# Patient Record
Sex: Male | Born: 1955 | Smoking: Current some day smoker
Health system: Southern US, Community
[De-identification: ages and names within clinical notes are randomized; demographics above are authoritative.]

## PROBLEM LIST (undated history)

## (undated) DIAGNOSIS — I639 Cerebral infarction, unspecified: Secondary | ICD-10-CM

## (undated) DIAGNOSIS — I2510 Atherosclerotic heart disease of native coronary artery without angina pectoris: Secondary | ICD-10-CM

## (undated) HISTORY — PX: HIP SURGERY: SHX245

---

## 2017-06-11 DIAGNOSIS — R05 Cough: Secondary | ICD-10-CM | POA: Diagnosis not present

## 2017-06-11 DIAGNOSIS — I10 Essential (primary) hypertension: Secondary | ICD-10-CM | POA: Diagnosis not present

## 2017-06-11 DIAGNOSIS — I2510 Atherosclerotic heart disease of native coronary artery without angina pectoris: Secondary | ICD-10-CM | POA: Diagnosis not present

## 2017-06-11 DIAGNOSIS — F411 Generalized anxiety disorder: Secondary | ICD-10-CM | POA: Diagnosis not present

## 2017-07-04 DIAGNOSIS — M5412 Radiculopathy, cervical region: Secondary | ICD-10-CM | POA: Diagnosis not present

## 2017-07-04 DIAGNOSIS — G894 Chronic pain syndrome: Secondary | ICD-10-CM | POA: Diagnosis not present

## 2017-07-04 DIAGNOSIS — M62838 Other muscle spasm: Secondary | ICD-10-CM | POA: Diagnosis not present

## 2017-07-04 DIAGNOSIS — M4722 Other spondylosis with radiculopathy, cervical region: Secondary | ICD-10-CM | POA: Diagnosis not present

## 2017-08-21 DIAGNOSIS — R29898 Other symptoms and signs involving the musculoskeletal system: Secondary | ICD-10-CM | POA: Diagnosis not present

## 2017-08-21 DIAGNOSIS — M6281 Muscle weakness (generalized): Secondary | ICD-10-CM | POA: Diagnosis not present

## 2017-08-21 DIAGNOSIS — G9589 Other specified diseases of spinal cord: Secondary | ICD-10-CM | POA: Diagnosis not present

## 2017-08-21 DIAGNOSIS — M542 Cervicalgia: Secondary | ICD-10-CM | POA: Diagnosis not present

## 2017-08-21 DIAGNOSIS — M6258 Muscle wasting and atrophy, not elsewhere classified, other site: Secondary | ICD-10-CM | POA: Diagnosis not present

## 2017-09-11 DIAGNOSIS — M542 Cervicalgia: Secondary | ICD-10-CM | POA: Diagnosis not present

## 2017-09-11 DIAGNOSIS — R29898 Other symptoms and signs involving the musculoskeletal system: Secondary | ICD-10-CM | POA: Diagnosis not present

## 2017-09-11 DIAGNOSIS — M6258 Muscle wasting and atrophy, not elsewhere classified, other site: Secondary | ICD-10-CM | POA: Diagnosis not present

## 2017-09-11 DIAGNOSIS — M6281 Muscle weakness (generalized): Secondary | ICD-10-CM | POA: Diagnosis not present

## 2017-09-11 DIAGNOSIS — G9589 Other specified diseases of spinal cord: Secondary | ICD-10-CM | POA: Diagnosis not present

## 2017-09-17 DIAGNOSIS — I10 Essential (primary) hypertension: Secondary | ICD-10-CM | POA: Diagnosis not present

## 2017-09-17 DIAGNOSIS — E7800 Pure hypercholesterolemia, unspecified: Secondary | ICD-10-CM | POA: Diagnosis not present

## 2017-09-17 DIAGNOSIS — Z125 Encounter for screening for malignant neoplasm of prostate: Secondary | ICD-10-CM | POA: Diagnosis not present

## 2017-09-17 DIAGNOSIS — T148XXA Other injury of unspecified body region, initial encounter: Secondary | ICD-10-CM | POA: Diagnosis not present

## 2017-10-01 DIAGNOSIS — G894 Chronic pain syndrome: Secondary | ICD-10-CM | POA: Diagnosis not present

## 2017-10-01 DIAGNOSIS — M6258 Muscle wasting and atrophy, not elsewhere classified, other site: Secondary | ICD-10-CM | POA: Diagnosis not present

## 2017-10-01 DIAGNOSIS — M21371 Foot drop, right foot: Secondary | ICD-10-CM | POA: Diagnosis not present

## 2017-10-01 DIAGNOSIS — M5412 Radiculopathy, cervical region: Secondary | ICD-10-CM | POA: Diagnosis not present

## 2017-10-04 DIAGNOSIS — M6258 Muscle wasting and atrophy, not elsewhere classified, other site: Secondary | ICD-10-CM | POA: Diagnosis not present

## 2017-10-04 DIAGNOSIS — G9589 Other specified diseases of spinal cord: Secondary | ICD-10-CM | POA: Diagnosis not present

## 2017-10-04 DIAGNOSIS — R29898 Other symptoms and signs involving the musculoskeletal system: Secondary | ICD-10-CM | POA: Diagnosis not present

## 2017-10-04 DIAGNOSIS — M542 Cervicalgia: Secondary | ICD-10-CM | POA: Diagnosis not present

## 2017-10-04 DIAGNOSIS — M6281 Muscle weakness (generalized): Secondary | ICD-10-CM | POA: Diagnosis not present

## 2017-10-18 DIAGNOSIS — M25471 Effusion, right ankle: Secondary | ICD-10-CM | POA: Diagnosis not present

## 2017-10-29 DIAGNOSIS — R079 Chest pain, unspecified: Secondary | ICD-10-CM | POA: Diagnosis not present

## 2017-10-29 DIAGNOSIS — K760 Fatty (change of) liver, not elsewhere classified: Secondary | ICD-10-CM | POA: Diagnosis not present

## 2017-10-29 DIAGNOSIS — R0602 Shortness of breath: Secondary | ICD-10-CM | POA: Diagnosis not present

## 2017-10-29 DIAGNOSIS — G894 Chronic pain syndrome: Secondary | ICD-10-CM | POA: Diagnosis not present

## 2017-10-29 DIAGNOSIS — R55 Syncope and collapse: Secondary | ICD-10-CM | POA: Diagnosis not present

## 2017-10-29 DIAGNOSIS — M21371 Foot drop, right foot: Secondary | ICD-10-CM | POA: Diagnosis not present

## 2017-10-29 DIAGNOSIS — G9589 Other specified diseases of spinal cord: Secondary | ICD-10-CM | POA: Diagnosis not present

## 2017-10-29 DIAGNOSIS — F10129 Alcohol abuse with intoxication, unspecified: Secondary | ICD-10-CM | POA: Diagnosis not present

## 2017-10-29 DIAGNOSIS — Z9889 Other specified postprocedural states: Secondary | ICD-10-CM | POA: Diagnosis not present

## 2017-10-29 DIAGNOSIS — R072 Precordial pain: Secondary | ICD-10-CM | POA: Diagnosis not present

## 2017-10-29 DIAGNOSIS — R2689 Other abnormalities of gait and mobility: Secondary | ICD-10-CM | POA: Diagnosis not present

## 2017-10-29 DIAGNOSIS — R296 Repeated falls: Secondary | ICD-10-CM | POA: Diagnosis not present

## 2017-10-29 DIAGNOSIS — I679 Cerebrovascular disease, unspecified: Secondary | ICD-10-CM | POA: Diagnosis not present

## 2017-10-29 DIAGNOSIS — R0789 Other chest pain: Secondary | ICD-10-CM | POA: Diagnosis not present

## 2017-10-29 DIAGNOSIS — I6782 Cerebral ischemia: Secondary | ICD-10-CM | POA: Diagnosis not present

## 2017-10-29 DIAGNOSIS — T148XXA Other injury of unspecified body region, initial encounter: Secondary | ICD-10-CM | POA: Diagnosis not present

## 2017-10-29 DIAGNOSIS — R531 Weakness: Secondary | ICD-10-CM | POA: Diagnosis not present

## 2017-10-29 DIAGNOSIS — R404 Transient alteration of awareness: Secondary | ICD-10-CM | POA: Diagnosis not present

## 2017-10-30 DIAGNOSIS — I2510 Atherosclerotic heart disease of native coronary artery without angina pectoris: Secondary | ICD-10-CM | POA: Diagnosis not present

## 2017-10-30 DIAGNOSIS — R079 Chest pain, unspecified: Secondary | ICD-10-CM | POA: Diagnosis not present

## 2017-10-30 DIAGNOSIS — R072 Precordial pain: Secondary | ICD-10-CM | POA: Diagnosis not present

## 2017-10-30 DIAGNOSIS — Z7982 Long term (current) use of aspirin: Secondary | ICD-10-CM | POA: Diagnosis not present

## 2017-10-30 DIAGNOSIS — R0789 Other chest pain: Secondary | ICD-10-CM | POA: Diagnosis not present

## 2017-10-30 DIAGNOSIS — R0609 Other forms of dyspnea: Secondary | ICD-10-CM | POA: Diagnosis not present

## 2017-10-31 DIAGNOSIS — R296 Repeated falls: Secondary | ICD-10-CM | POA: Diagnosis not present

## 2017-10-31 DIAGNOSIS — G894 Chronic pain syndrome: Secondary | ICD-10-CM | POA: Diagnosis not present

## 2017-10-31 DIAGNOSIS — Z955 Presence of coronary angioplasty implant and graft: Secondary | ICD-10-CM | POA: Diagnosis not present

## 2017-10-31 DIAGNOSIS — G9589 Other specified diseases of spinal cord: Secondary | ICD-10-CM | POA: Diagnosis not present

## 2017-10-31 DIAGNOSIS — T148XXA Other injury of unspecified body region, initial encounter: Secondary | ICD-10-CM | POA: Diagnosis not present

## 2017-10-31 DIAGNOSIS — I2510 Atherosclerotic heart disease of native coronary artery without angina pectoris: Secondary | ICD-10-CM | POA: Diagnosis not present

## 2017-10-31 DIAGNOSIS — M6281 Muscle weakness (generalized): Secondary | ICD-10-CM | POA: Diagnosis not present

## 2017-11-02 DIAGNOSIS — I25118 Atherosclerotic heart disease of native coronary artery with other forms of angina pectoris: Secondary | ICD-10-CM | POA: Diagnosis not present

## 2017-11-02 DIAGNOSIS — R296 Repeated falls: Secondary | ICD-10-CM | POA: Diagnosis not present

## 2017-11-02 DIAGNOSIS — M4722 Other spondylosis with radiculopathy, cervical region: Secondary | ICD-10-CM | POA: Diagnosis not present

## 2017-11-02 DIAGNOSIS — M545 Low back pain: Secondary | ICD-10-CM | POA: Diagnosis not present

## 2017-11-06 ENCOUNTER — Encounter: Payer: Self-pay | Admitting: *Deleted

## 2017-11-06 ENCOUNTER — Other Ambulatory Visit: Payer: Self-pay | Admitting: *Deleted

## 2017-11-06 NOTE — Patient Outreach (Signed)
[facility]) Care Management  11/06/2017  LABAN OROURKE [birth year] [id number]  Referral via [facility]; member discharged from inpatient admission from [facility] 10/31/2017:  Telephone call to patient; recording states member not available; left HIPPA compliant voice mail.  Plan: Geophysicist/field seismologist. Follow up 2-4 business days.    Sherrin Daisy, RN BSN [place] Management Coordinator [facility] Care Management  [phone]

## 2017-11-08 ENCOUNTER — Other Ambulatory Visit: Payer: Self-pay | Admitting: *Deleted

## 2017-11-08 DIAGNOSIS — R748 Abnormal levels of other serum enzymes: Secondary | ICD-10-CM | POA: Diagnosis not present

## 2017-11-08 NOTE — Patient Outreach (Signed)
[facility]) Care Management  11/08/2017  Aaron Hicks December 09, 1955 [id number]  Referral via [place]; member discharged from inpatient admission from [place] 10/31/2017:  Telephone call attempt x 2; left HIPPA compliant voice mail requesting call back.  Plan: Follow up 2-4 business days. Outreach letter was sent 11/06/2017.   Sherrin Daisy, RN BSN [place] Management Coordinator [facility] Care Management  [phone]

## 2017-11-12 DIAGNOSIS — K219 Gastro-esophageal reflux disease without esophagitis: Secondary | ICD-10-CM | POA: Diagnosis not present

## 2017-11-12 DIAGNOSIS — G4700 Insomnia, unspecified: Secondary | ICD-10-CM | POA: Diagnosis not present

## 2017-11-12 DIAGNOSIS — G9589 Other specified diseases of spinal cord: Secondary | ICD-10-CM | POA: Diagnosis not present

## 2017-11-12 DIAGNOSIS — M4304 Spondylolysis, thoracic region: Secondary | ICD-10-CM | POA: Diagnosis not present

## 2017-11-12 DIAGNOSIS — G894 Chronic pain syndrome: Secondary | ICD-10-CM | POA: Diagnosis not present

## 2017-11-12 DIAGNOSIS — F329 Major depressive disorder, single episode, unspecified: Secondary | ICD-10-CM | POA: Diagnosis not present

## 2017-11-12 DIAGNOSIS — G8334 Monoplegia, unspecified affecting left nondominant side: Secondary | ICD-10-CM | POA: Diagnosis not present

## 2017-11-12 DIAGNOSIS — M4306 Spondylolysis, lumbar region: Secondary | ICD-10-CM | POA: Diagnosis not present

## 2017-11-12 DIAGNOSIS — M5412 Radiculopathy, cervical region: Secondary | ICD-10-CM | POA: Diagnosis not present

## 2017-11-12 DIAGNOSIS — Z9181 History of falling: Secondary | ICD-10-CM | POA: Diagnosis not present

## 2017-11-12 DIAGNOSIS — Z8673 Personal history of transient ischemic attack (TIA), and cerebral infarction without residual deficits: Secondary | ICD-10-CM | POA: Diagnosis not present

## 2017-11-12 DIAGNOSIS — Z7902 Long term (current) use of antithrombotics/antiplatelets: Secondary | ICD-10-CM | POA: Diagnosis not present

## 2017-11-12 DIAGNOSIS — E7800 Pure hypercholesterolemia, unspecified: Secondary | ICD-10-CM | POA: Diagnosis not present

## 2017-11-12 DIAGNOSIS — I10 Essential (primary) hypertension: Secondary | ICD-10-CM | POA: Diagnosis not present

## 2017-11-12 DIAGNOSIS — Z87891 Personal history of nicotine dependence: Secondary | ICD-10-CM | POA: Diagnosis not present

## 2017-11-12 DIAGNOSIS — M21371 Foot drop, right foot: Secondary | ICD-10-CM | POA: Diagnosis not present

## 2017-11-12 DIAGNOSIS — I2510 Atherosclerotic heart disease of native coronary artery without angina pectoris: Secondary | ICD-10-CM | POA: Diagnosis not present

## 2017-11-12 DIAGNOSIS — J309 Allergic rhinitis, unspecified: Secondary | ICD-10-CM | POA: Diagnosis not present

## 2017-11-20 ENCOUNTER — Other Ambulatory Visit: Payer: Self-pay | Admitting: *Deleted

## 2017-11-20 NOTE — Patient Outreach (Signed)
Aaron Hicks) Care Management  11/20/2017  Aaron Hicks January 02, 1956 [id number]   Referral via [place]; member discharged from inpatient admission from [place] 10/31/2017:  Telephone call #3 to patient; spoke with patient & advised of reason for call & of [facility] care management services.  HIPPA verification received.   Patient states  that he was in [facility] for 2 days under observation. States he had a fall prior to going to hospital but also had heart checked out to hx of chest discomfort for several days.   Voices that he is home now and has already see primary provider for follow up visit. States he has all of medications that he is suppose to have and is taking as prescribed by MD consistently. States he has no activity restrictions & is able to drive himself to MD appointments. Voices he sees other specialists as needed.  States he currently uses cane due to previous stroke .   States he knows when to call MD for abnormal symptoms & knows to call 911 for emergency medical problems.  States he has no health care concerns at this time & does not need [place] services. States he has contact information if needed.  Plan; Case closure.  Sherrin Daisy, RN BSN [place] Management Coordinator [facility] Care Management  [phone]

## 2017-12-11 DIAGNOSIS — N529 Male erectile dysfunction, unspecified: Secondary | ICD-10-CM | POA: Diagnosis not present

## 2017-12-11 DIAGNOSIS — R110 Nausea: Secondary | ICD-10-CM | POA: Diagnosis not present

## 2017-12-25 DIAGNOSIS — G894 Chronic pain syndrome: Secondary | ICD-10-CM | POA: Diagnosis not present

## 2018-02-27 DIAGNOSIS — E7800 Pure hypercholesterolemia, unspecified: Secondary | ICD-10-CM | POA: Diagnosis not present

## 2018-02-27 DIAGNOSIS — Z1211 Encounter for screening for malignant neoplasm of colon: Secondary | ICD-10-CM | POA: Diagnosis not present

## 2018-02-27 DIAGNOSIS — F411 Generalized anxiety disorder: Secondary | ICD-10-CM | POA: Diagnosis not present

## 2018-02-27 DIAGNOSIS — I10 Essential (primary) hypertension: Secondary | ICD-10-CM | POA: Diagnosis not present

## 2018-02-27 DIAGNOSIS — Z0001 Encounter for general adult medical examination with abnormal findings: Secondary | ICD-10-CM | POA: Diagnosis not present

## 2018-02-27 DIAGNOSIS — J302 Other seasonal allergic rhinitis: Secondary | ICD-10-CM | POA: Diagnosis not present

## 2018-02-27 DIAGNOSIS — Z23 Encounter for immunization: Secondary | ICD-10-CM | POA: Diagnosis not present

## 2018-03-20 ENCOUNTER — Inpatient Hospital Stay (HOSPITAL_COMMUNITY)
Admission: EM | Admit: 2018-03-20 | Discharge: 2018-03-21 | DRG: 092 | Disposition: A | Discharge status: Home / Self Care | Payer: PPO | Attending: Internal Medicine | Admitting: Internal Medicine

## 2018-03-20 ENCOUNTER — Encounter (HOSPITAL_COMMUNITY): Payer: Self-pay | Admitting: *Deleted

## 2018-03-20 ENCOUNTER — Inpatient Hospital Stay (HOSPITAL_COMMUNITY): Payer: PPO

## 2018-03-20 ENCOUNTER — Other Ambulatory Visit: Payer: Self-pay

## 2018-03-20 ENCOUNTER — Emergency Department (HOSPITAL_COMMUNITY): Payer: PPO

## 2018-03-20 DIAGNOSIS — Z87891 Personal history of nicotine dependence: Secondary | ICD-10-CM | POA: Diagnosis not present

## 2018-03-20 DIAGNOSIS — Z7902 Long term (current) use of antithrombotics/antiplatelets: Secondary | ICD-10-CM | POA: Diagnosis not present

## 2018-03-20 DIAGNOSIS — M4802 Spinal stenosis, cervical region: Secondary | ICD-10-CM | POA: Diagnosis not present

## 2018-03-20 DIAGNOSIS — R748 Abnormal levels of other serum enzymes: Secondary | ICD-10-CM | POA: Diagnosis not present

## 2018-03-20 DIAGNOSIS — G8929 Other chronic pain: Secondary | ICD-10-CM | POA: Diagnosis not present

## 2018-03-20 DIAGNOSIS — I252 Old myocardial infarction: Secondary | ICD-10-CM | POA: Diagnosis not present

## 2018-03-20 DIAGNOSIS — Z7982 Long term (current) use of aspirin: Secondary | ICD-10-CM | POA: Diagnosis not present

## 2018-03-20 DIAGNOSIS — M5023 Other cervical disc displacement, cervicothoracic region: Secondary | ICD-10-CM | POA: Diagnosis not present

## 2018-03-20 DIAGNOSIS — I2510 Atherosclerotic heart disease of native coronary artery without angina pectoris: Secondary | ICD-10-CM | POA: Diagnosis not present

## 2018-03-20 DIAGNOSIS — M48061 Spinal stenosis, lumbar region without neurogenic claudication: Secondary | ICD-10-CM | POA: Diagnosis present

## 2018-03-20 DIAGNOSIS — R7989 Other specified abnormal findings of blood chemistry: Secondary | ICD-10-CM | POA: Diagnosis present

## 2018-03-20 DIAGNOSIS — Z79891 Long term (current) use of opiate analgesic: Secondary | ICD-10-CM

## 2018-03-20 DIAGNOSIS — R270 Ataxia, unspecified: Secondary | ICD-10-CM | POA: Diagnosis not present

## 2018-03-20 DIAGNOSIS — I10 Essential (primary) hypertension: Secondary | ICD-10-CM | POA: Diagnosis not present

## 2018-03-20 DIAGNOSIS — R29898 Other symptoms and signs involving the musculoskeletal system: Secondary | ICD-10-CM | POA: Diagnosis present

## 2018-03-20 DIAGNOSIS — I69351 Hemiplegia and hemiparesis following cerebral infarction affecting right dominant side: Secondary | ICD-10-CM

## 2018-03-20 DIAGNOSIS — F10120 Alcohol abuse with intoxication, uncomplicated: Secondary | ICD-10-CM | POA: Diagnosis not present

## 2018-03-20 DIAGNOSIS — R296 Repeated falls: Secondary | ICD-10-CM | POA: Diagnosis not present

## 2018-03-20 DIAGNOSIS — M5031 Other cervical disc degeneration,  high cervical region: Secondary | ICD-10-CM | POA: Diagnosis not present

## 2018-03-20 DIAGNOSIS — M47812 Spondylosis without myelopathy or radiculopathy, cervical region: Secondary | ICD-10-CM | POA: Diagnosis not present

## 2018-03-20 DIAGNOSIS — Z886 Allergy status to analgesic agent status: Secondary | ICD-10-CM

## 2018-03-20 DIAGNOSIS — R269 Unspecified abnormalities of gait and mobility: Principal | ICD-10-CM | POA: Diagnosis present

## 2018-03-20 DIAGNOSIS — Z955 Presence of coronary angioplasty implant and graft: Secondary | ICD-10-CM

## 2018-03-20 DIAGNOSIS — M5412 Radiculopathy, cervical region: Secondary | ICD-10-CM | POA: Diagnosis not present

## 2018-03-20 DIAGNOSIS — Z91030 Bee allergy status: Secondary | ICD-10-CM

## 2018-03-20 DIAGNOSIS — F10920 Alcohol use, unspecified with intoxication, uncomplicated: Secondary | ICD-10-CM

## 2018-03-20 DIAGNOSIS — M5126 Other intervertebral disc displacement, lumbar region: Secondary | ICD-10-CM | POA: Diagnosis not present

## 2018-03-20 DIAGNOSIS — Z981 Arthrodesis status: Secondary | ICD-10-CM | POA: Diagnosis not present

## 2018-03-20 DIAGNOSIS — Z79899 Other long term (current) drug therapy: Secondary | ICD-10-CM

## 2018-03-20 DIAGNOSIS — K219 Gastro-esophageal reflux disease without esophagitis: Secondary | ICD-10-CM | POA: Diagnosis not present

## 2018-03-20 DIAGNOSIS — R778 Other specified abnormalities of plasma proteins: Secondary | ICD-10-CM

## 2018-03-20 DIAGNOSIS — G629 Polyneuropathy, unspecified: Secondary | ICD-10-CM | POA: Diagnosis not present

## 2018-03-20 DIAGNOSIS — F419 Anxiety disorder, unspecified: Secondary | ICD-10-CM | POA: Diagnosis present

## 2018-03-20 DIAGNOSIS — R531 Weakness: Secondary | ICD-10-CM

## 2018-03-20 DIAGNOSIS — M6281 Muscle weakness (generalized): Secondary | ICD-10-CM | POA: Diagnosis not present

## 2018-03-20 HISTORY — DX: Cerebral infarction, unspecified: I63.9

## 2018-03-20 HISTORY — DX: Atherosclerotic heart disease of native coronary artery without angina pectoris: I25.10

## 2018-03-20 LAB — CBC WITH DIFFERENTIAL/PLATELET
ABS IMMATURE GRANULOCYTES: 0.04 10*3/uL (ref 0.00–0.07)
Basophils Absolute: 0 10*3/uL (ref 0.0–0.1)
Basophils Relative: 1 %
EOS PCT: 0 %
Eosinophils Absolute: 0 10*3/uL (ref 0.0–0.5)
HCT: 41.7 % (ref 39.0–52.0)
HEMOGLOBIN: 13.3 g/dL (ref 13.0–17.0)
Immature Granulocytes: 1 %
LYMPHS ABS: 1.6 10*3/uL (ref 0.7–4.0)
LYMPHS PCT: 35 %
MCH: 33.3 pg (ref 26.0–34.0)
MCHC: 31.9 g/dL (ref 30.0–36.0)
MCV: 104.3 fL — AB (ref 80.0–100.0)
MONO ABS: 0.3 10*3/uL (ref 0.1–1.0)
MONOS PCT: 6 %
Neutro Abs: 2.6 10*3/uL (ref 1.7–7.7)
Neutrophils Relative %: 57 %
Platelets: 227 10*3/uL (ref 150–400)
RBC: 4 MIL/uL — ABNORMAL LOW (ref 4.22–5.81)
RDW: 14.7 % (ref 11.5–15.5)
WBC: 4.6 10*3/uL (ref 4.0–10.5)
nRBC: 0 % (ref 0.0–0.2)

## 2018-03-20 LAB — HEMOGLOBIN A1C
Hgb A1c MFr Bld: 6 % — ABNORMAL HIGH (ref 4.8–5.6)
MEAN PLASMA GLUCOSE: 125.5 mg/dL

## 2018-03-20 LAB — RAPID URINE DRUG SCREEN, HOSP PERFORMED
Amphetamines: NOT DETECTED
BARBITURATES: NOT DETECTED
Benzodiazepines: NOT DETECTED
Cocaine: NOT DETECTED
OPIATES: NOT DETECTED
TETRAHYDROCANNABINOL: NOT DETECTED

## 2018-03-20 LAB — URINALYSIS, ROUTINE W REFLEX MICROSCOPIC
Bilirubin Urine: NEGATIVE
GLUCOSE, UA: NEGATIVE mg/dL
HGB URINE DIPSTICK: NEGATIVE
Ketones, ur: 20 mg/dL — AB
LEUKOCYTES UA: NEGATIVE
NITRITE: NEGATIVE
PH: 6 (ref 5.0–8.0)
PROTEIN: 30 mg/dL — AB
Specific Gravity, Urine: 1.009 (ref 1.005–1.030)

## 2018-03-20 LAB — VITAMIN B12: Vitamin B-12: 459 pg/mL (ref 180–914)

## 2018-03-20 LAB — TROPONIN I
TROPONIN I: 0.28 ng/mL — AB (ref <0.03)
TROPONIN I: 0.22 ng/mL — AB (ref <0.03)
Troponin I: 0.28 ng/mL (ref <0.03)

## 2018-03-20 LAB — COMPREHENSIVE METABOLIC PANEL
ALK PHOS: 44 U/L (ref 38–126)
ALT: 36 U/L (ref 0–44)
AST: 51 U/L — AB (ref 15–41)
Albumin: 4.1 g/dL (ref 3.5–5.0)
Anion gap: 23 — ABNORMAL HIGH (ref 5–15)
BUN: 13 mg/dL (ref 6–20)
CALCIUM: 8.9 mg/dL (ref 8.9–10.3)
CO2: 16 mmol/L — AB (ref 22–32)
CREATININE: 1.11 mg/dL (ref 0.61–1.24)
Chloride: 100 mmol/L (ref 98–111)
Glucose, Bld: 73 mg/dL (ref 70–99)
Potassium: 4.4 mmol/L (ref 3.5–5.1)
Sodium: 139 mmol/L (ref 135–145)
Total Bilirubin: 1 mg/dL (ref 0.3–1.2)
Total Protein: 7 g/dL (ref 6.5–8.1)

## 2018-03-20 LAB — ETHANOL: Alcohol, Ethyl (B): 296 mg/dL — ABNORMAL HIGH (ref <10)

## 2018-03-20 MED ORDER — GABAPENTIN 600 MG PO TABS
600.0000 mg | ORAL_TABLET | Freq: Three times a day (TID) | ORAL | Status: DC
  Administered 2018-03-20 – 2018-03-21 (×3): 600 mg via ORAL
  Filled 2018-03-20 (×4): qty 1

## 2018-03-20 MED ORDER — CLOPIDOGREL BISULFATE 75 MG PO TABS: 75.0000 mg | ORAL_TABLET | Freq: Once | ORAL | Status: DC

## 2018-03-20 MED ORDER — OXYCODONE HCL 5 MG PO TABS
5.0000 mg | ORAL_TABLET | Freq: Three times a day (TID) | ORAL | Status: DC | PRN
  Administered 2018-03-20 – 2018-03-21 (×3): 5 mg via ORAL
  Filled 2018-03-20 (×3): qty 1

## 2018-03-20 MED ORDER — CLONAZEPAM 0.5 MG PO TBDP
0.5000 mg | ORAL_TABLET | Freq: Every day | ORAL | Status: DC
  Administered 2018-03-21: 0.5 mg via ORAL
  Filled 2018-03-20: qty 2

## 2018-03-20 MED ORDER — ADULT MULTIVITAMIN W/MINERALS CH
1.0000 | ORAL_TABLET | Freq: Every day | ORAL | Status: DC
  Administered 2018-03-20 – 2018-03-21 (×2): 1 via ORAL
  Filled 2018-03-20 (×2): qty 1

## 2018-03-20 MED ORDER — LORAZEPAM 2 MG/ML IJ SOLN
1.0000 mg | Freq: Four times a day (QID) | INTRAMUSCULAR | Status: DC | PRN
  Administered 2018-03-20: 1 mg via INTRAVENOUS
  Filled 2018-03-20: qty 1

## 2018-03-20 MED ORDER — THIAMINE HCL 100 MG/ML IJ SOLN
100.0000 mg | Freq: Every day | INTRAMUSCULAR | Status: DC
  Filled 2018-03-20: qty 2

## 2018-03-20 MED ORDER — LORAZEPAM 2 MG/ML IJ SOLN: 0.0000 mg | Freq: Two times a day (BID) | INTRAMUSCULAR | Status: DC

## 2018-03-20 MED ORDER — CLOPIDOGREL BISULFATE 75 MG PO TABS
75.0000 mg | ORAL_TABLET | Freq: Every day | ORAL | Status: DC
  Administered 2018-03-21: 75 mg via ORAL
  Filled 2018-03-20: qty 1

## 2018-03-20 MED ORDER — CARVEDILOL 12.5 MG PO TABS
6.2500 mg | ORAL_TABLET | Freq: Two times a day (BID) | ORAL | Status: DC
  Administered 2018-03-21 (×2): 6.25 mg via ORAL
  Filled 2018-03-20 (×3): qty 1

## 2018-03-20 MED ORDER — ASPIRIN EC 81 MG PO TBEC
81.0000 mg | DELAYED_RELEASE_TABLET | Freq: Every day | ORAL | Status: DC
  Administered 2018-03-21: 81 mg via ORAL
  Filled 2018-03-20 (×2): qty 1

## 2018-03-20 MED ORDER — SENNOSIDES-DOCUSATE SODIUM 8.6-50 MG PO TABS: 1.0000 | ORAL_TABLET | Freq: Every evening | ORAL | Status: DC | PRN

## 2018-03-20 MED ORDER — FOLIC ACID 1 MG PO TABS
1.0000 mg | ORAL_TABLET | Freq: Every day | ORAL | Status: DC
  Administered 2018-03-20 – 2018-03-21 (×2): 1 mg via ORAL
  Filled 2018-03-20 (×2): qty 1

## 2018-03-20 MED ORDER — PANTOPRAZOLE SODIUM 40 MG PO TBEC
40.0000 mg | DELAYED_RELEASE_TABLET | Freq: Every day | ORAL | Status: DC
  Administered 2018-03-21: 40 mg via ORAL
  Filled 2018-03-20 (×2): qty 1

## 2018-03-20 MED ORDER — LACTATED RINGERS IV SOLN
INTRAVENOUS | Status: AC
  Administered 2018-03-20: 1000 mL via INTRAVENOUS

## 2018-03-20 MED ORDER — LORAZEPAM 1 MG PO TABS: 1.0000 mg | ORAL_TABLET | Freq: Four times a day (QID) | ORAL | Status: DC | PRN

## 2018-03-20 MED ORDER — KETOTIFEN FUMARATE 0.025 % OP SOLN
1.0000 [drp] | Freq: Two times a day (BID) | OPHTHALMIC | Status: DC
  Administered 2018-03-20: 1 [drp] via OPHTHALMIC
  Filled 2018-03-20: qty 5

## 2018-03-20 MED ORDER — ONDANSETRON HCL 4 MG PO TABS: 4.0000 mg | ORAL_TABLET | Freq: Three times a day (TID) | ORAL | Status: DC | PRN

## 2018-03-20 MED ORDER — VITAMIN B-1 100 MG PO TABS
100.0000 mg | ORAL_TABLET | Freq: Every day | ORAL | Status: DC
  Administered 2018-03-20 – 2018-03-21 (×2): 100 mg via ORAL
  Filled 2018-03-20 (×2): qty 1

## 2018-03-20 MED ORDER — ENOXAPARIN SODIUM 40 MG/0.4ML ~~LOC~~ SOLN
40.0000 mg | SUBCUTANEOUS | Status: DC
  Administered 2018-03-20: 40 mg via SUBCUTANEOUS
  Filled 2018-03-20: qty 0.4

## 2018-03-20 MED ORDER — LORAZEPAM 2 MG/ML IJ SOLN
0.0000 mg | Freq: Four times a day (QID) | INTRAMUSCULAR | Status: DC
  Administered 2018-03-20: 1 mg via INTRAVENOUS
  Administered 2018-03-20: 2 mg via INTRAVENOUS
  Filled 2018-03-20 (×2): qty 1

## 2018-03-20 MED ORDER — LISINOPRIL 20 MG PO TABS
40.0000 mg | ORAL_TABLET | Freq: Every day | ORAL | Status: DC
  Administered 2018-03-21: 40 mg via ORAL
  Filled 2018-03-20 (×2): qty 2

## 2018-03-20 MED ORDER — ATORVASTATIN CALCIUM 20 MG PO TABS
20.0000 mg | ORAL_TABLET | Freq: Every day | ORAL | Status: DC
  Administered 2018-03-21: 20 mg via ORAL
  Filled 2018-03-20: qty 1
  Filled 2018-03-20 (×2): qty 2

## 2018-03-20 NOTE — ED Triage Notes (Signed)
Patient presents to ed via [facility] states he has had increased falls over the last 3 days, states he ran out of his pain medication and that's when he drinks. Admits to drinking approx. 1/2 gallon vodka per day. States he has a history of stroke in the past and his right leg is weak.

## 2018-03-20 NOTE — ED Notes (Signed)
Dr. Lita Mains aware of trop ion

## 2018-03-20 NOTE — ED Notes (Signed)
Attempted IV x 2 unsuccessful.

## 2018-03-20 NOTE — ED Provider Notes (Signed)
[place] EMERGENCY DEPARTMENT Provider Note   CSN: [id number] Arrival date & time: 03/20/18  7341     History   Chief Complaint Chief Complaint  Patient presents with  . Fall    HPI Aaron Hicks is a 62 y.o. male.  HPI Patient states he has had multiple falls after developing right-sided weakness 2 days ago.  No known head injuries.  States he is had previous strokes affecting his right side in the past.  Normally ambulates with a cane.  Takes oxycodone for chronic pain but ran out of the medications and has been drinking alcohol excessively.  Drank this morning.  Past Medical History:  Diagnosis Date  . Coronary artery disease   . Stroke [facility])     There are no active problems to display for this patient.   Past Surgical History:  Procedure Laterality Date  . HIP SURGERY          Home Medications    Prior to Admission medications   Medication Sig Start Date End Date Taking? Authorizing Provider  aspirin EC 81 MG tablet Take 81 mg by mouth daily at 12 noon. 10/02/13  Yes [provider]  atorvastatin (LIPITOR) 20 MG tablet Take 20 mg by mouth daily.   Yes [provider]  carvedilol (COREG) 6.25 MG tablet Take 6.25 mg by mouth.   Yes [provider]  clonazePAM (KLONOPIN) 0.5 MG disintegrating tablet Take 0.5 mg by mouth as needed. 02/27/18  Yes [provider]  clopidogrel (PLAVIX) 75 MG tablet Take 75 mg by mouth once.   Yes [provider]  EPINEPHrine 0.3 mg/0.3 mL IJ SOAJ injection Inject 0.3 mLs into the skin once. 08/07/13  Yes [provider]  folic acid (FOLVITE) 1 MG tablet Take 1 mg by mouth daily at 12 noon. 08/27/16  Yes [provider]  gabapentin (NEURONTIN) 600 MG tablet Take 600 mg by mouth 3 (three) times daily.   Yes [provider]  ketotifen (ZADITOR) 0.025 % ophthalmic solution Place 1 drop into both eyes 2 (two) times daily. 02/27/18 03/29/18 Yes [provider]  lisinopril (PRINIVIL,ZESTRIL) 40 MG tablet Take 40 mg by mouth daily.    Yes [provider]  Multiple Vitamins-Iron (MULTIVITAMIN/IRON PO) Take 1 tablet by mouth daily at 12 noon. 08/26/16  Yes [provider]  ondansetron (ZOFRAN) 4 MG tablet Take 4 mg by mouth every 8 (eight) hours as needed for nausea/vomiting. 02/20/18  Yes [provider]  oxycodone (OXY-IR) 5 MG capsule Take 5 mg by mouth every 4 (four) hours as needed.   Yes [provider]  pantoprazole (PROTONIX) 40 MG tablet Take 40 mg by mouth daily.   Yes [provider]  STOOL SOFTENER 100 MG capsule Take 100 mg by mouth daily at 12 noon. 03/06/18  Yes [provider]  tiZANidine (ZANAFLEX) 4 MG tablet Take 4 mg by mouth every 6 (six) hours as needed for muscle spasms.   Yes [provider]    Family History No family history on file.  Social History Social History   Tobacco Use  . Smoking status: Current Some Day Smoker  . Smokeless tobacco: Never Used  Substance Use Topics  . Alcohol use: Yes  . Drug use: Not Currently     Allergies   Bee venom; Other; and Acetaminophen   Review of Systems Review of Systems  Constitutional: Negative for chills and fever.  HENT: Negative for facial swelling.  Respiratory: Negative for cough and shortness of breath.   Cardiovascular: Negative for chest pain.  Gastrointestinal: Negative for abdominal pain, diarrhea, nausea and vomiting.  Genitourinary: Negative for dysuria, flank pain and frequency.  Musculoskeletal: Positive for back pain and gait problem. Negative for arthralgias and myalgias.  Skin: Negative for rash and wound.  Neurological: Positive for weakness. Negative for dizziness, light-headedness, numbness and headaches.  All other systems reviewed and are negative.    Physical Exam Updated Vital Signs BP 116/76   Pulse 73   Temp (!) 97.5 F (36.4 C) (Oral)   Resp 12   SpO2 94%    Physical Exam  Constitutional: He is oriented to person, place, and time. He appears well-developed and well-nourished. No distress.  HENT:  Head: Normocephalic and atraumatic.  Mouth/Throat: Oropharynx is clear and moist.  No obvious head injury.  Eyes: Pupils are equal, round, and reactive to light. EOM are normal.  Neck: Normal range of motion. Neck supple.  No posterior midline cervical tenderness to palpation.  Cardiovascular: Normal rate and regular rhythm. Exam reveals no gallop and no friction rub.  No murmur heard. Pulmonary/Chest: Effort normal and breath sounds normal.  Abdominal: Soft. Bowel sounds are normal. There is no tenderness. There is no rebound and no guarding.  Musculoskeletal: Normal range of motion. He exhibits no edema or tenderness.  Neurological: He is alert and oriented to person, place, and time.  3/5 right lower extremity.  5/5 left lower extremity.  4/5 right and 5/5 strength left grip.  This does seem to be effort dependent.  Sensation to light touch intact.  No facial asymmetry.  Skin: Skin is warm and dry. Capillary refill takes less than 2 seconds. No rash noted. He is not diaphoretic. No erythema.  Psychiatric: He has a normal mood and affect. His behavior is normal.  Nursing note and vitals reviewed.    ED Treatments / Results  Labs (all labs ordered are listed, but only abnormal results are displayed) Labs Reviewed  CBC WITH DIFFERENTIAL/PLATELET - Abnormal; Notable for the following components:      Result Value   RBC 4.00 (*)    MCV 104.3 (*)    All other components within normal limits  COMPREHENSIVE METABOLIC PANEL - Abnormal; Notable for the following components:   CO2 16 (*)    AST 51 (*)    Anion gap 23 (*)    All other components within normal limits  ETHANOL - Abnormal; Notable for the following components:   Alcohol, Ethyl (B) 296 (*)    All other components within normal limits  URINALYSIS, ROUTINE W REFLEX MICROSCOPIC -  Abnormal; Notable for the following components:   Ketones, ur 20 (*)    Protein, ur 30 (*)    Bacteria, UA RARE (*)    All other components within normal limits  TROPONIN I - Abnormal; Notable for the following components:   Troponin I 0.28 (*)    All other components within normal limits  RAPID URINE DRUG SCREEN, HOSP PERFORMED    EKG EKG Interpretation  Date/Time:  Wednesday March 20 2018 09:30:39 EDT Ventricular Rate:  67 PR Interval:    QRS Duration: 97 QT Interval:  438 QTC Calculation: 463 R Axis:   60 Text Interpretation:  Sinus rhythm Confirmed by Julianne Rice [phone]) on 03/20/2018 10:27:25 AM   Radiology Ct Head Wo Contrast  Result Date: 03/20/2018 CLINICAL DATA:  Falls over the past 3 days. EXAM: CT HEAD WITHOUT CONTRAST TECHNIQUE: Contiguous  axial images were obtained from the base of the skull through the vertex without intravenous contrast. COMPARISON:  10/29/2017 FINDINGS: Brain: There is atrophy and chronic small vessel disease changes. No acute intracranial abnormality. Specifically, no hemorrhage, hydrocephalus, mass lesion, acute infarction, or significant intracranial injury. Vascular: No hyperdense vessel or unexpected calcification. Skull: No acute calvarial abnormality. Sinuses/Orbits: No acute findings Other: None IMPRESSION: No acute intracranial abnormality. Atrophy, chronic microvascular disease. Electronically Signed   By: Rolm Baptise M.D.   On: 03/20/2018 11:42    Procedures Procedures (including critical care time)  Medications Ordered in ED Medications - No data to display   Initial Impression / Assessment and Plan / ED Course  I have reviewed the triage vital signs and the nursing notes.  Pertinent labs & imaging results that were available during my care of the patient were reviewed by me and considered in my medical decision making (see chart for details).     Mild elevation in troponin.  Patient is currently denying any chest pain.   Discussed with cardiology recommend inpatient observation and trending of troponin.  CT head without acute findings.  Discussed with internal medicine resident who will see patient in emergency department and admit Final Clinical Impressions(s) / ED Diagnoses   Final diagnoses:  Right sided weakness  Alcoholic intoxication without complication ([place])  Elevated troponin    ED Discharge Orders    None       Julianne Rice, MD 03/20/18 1250

## 2018-03-20 NOTE — ED Notes (Signed)
Patient states she was driving yest and states she felt like her body was leaning to the left however she didn't actually feel like she was, states she was able to stay in her lane in the road denies dizziness states she drove home and her eyes felt funny , was walking to the mailbox and felt unsteady on her feet. C/o left temperal headache states she has a history of migraines , took her medication today states she is still feeling the same.

## 2018-03-20 NOTE — H&P (Addendum)
   Date: 03/20/2018               Patient Name:  Aaron Hicks MRN: [id number]  DOB: 05/08/1956 Age / Sex: 61 y.o., male   PCP: No primary care provider on file.         Medical Service: Internal Medicine Teaching Service         Attending Physician: Dr. Narendra, Nischal, MD    First Contact: Dr.  Pager: [phone]  Second Contact: Dr. Hoffman Pager: [phone]       After Hours (After 5p/  First Contact Pager: [phone]  weekends / holidays): Second Contact Pager: [phone]   Chief Complaint: Weakness  History of Present Illness: This is a 61 year old male with a history of CVA with residual right-sided weakness, anxiety, polyneuropathy,CAD (s/p stenting, on ASA and Plavix), cervical radiculopathy, HTN, CAD, GERD, history of EtOH use(history of withdrawals) and anxiety who presented with new onset right sided weakness. He reports he was at home when he started having worsening of his right-sided weakness last night, when he woke up this morning he reports that he was significantly weaker and could not get up out of bed to go to the bathroom. He reports that he uses a cane at his baseline.  Denies anything else such as fevers, chills, vomiting, headaches, or dizziness.  He reports that he has chronic pain and takes oxycodone 3 times a day, he reports that he ran out of this about 2 days ago.  He reports that he normally drinks alcohol to relieve his pain and reports that he bought some alcohol however he reports that he increase the amount that he normally drinks. He states that he drinks about 2 drinks a day, will drink any type of alcohol varying from the liquor, wine, beer, and mixed drinks. Told ED that he drinks up to 1/2 gallon of vodka a day. 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 He reports that he has never had any troubles from alcohol withdrawal, no seizures or tremors.  He reports he has never been hospitalized  for alcohol withdrawal.  He denies any other substance use.  Last drink was this morning.   The ED patient was found to have a temperature of 97.5, 18, HR 75, BP 115/92.  UA showed 20 ketones and 30 protein.  CBC showed a WBC of 4.6, hemoglobin 13.3, MCV 104.3, and platelet 227.  CMP showed electrolytes with AST 51 ALT 36 alk phos 44 1.  UDS is negative.  Ethanol was elevated to 296.  Troponin was 0.28.  CT head showed no acute abnormalities, just atrophy and chronic microvascular disease.  Patient was admitted to internal medicine.    Meds:  Current Meds  Medication Sig  . aspirin EC 81 MG tablet Take 81 mg by mouth daily at 12 noon.  . atorvastatin (LIPITOR) 20 MG tablet Take 20 mg by mouth daily.  . carvedilol (COREG) 6.25 MG tablet Take 6.25 mg by mouth.  . clonazePAM (KLONOPIN) 0.5 MG disintegrating tablet Take 0.5 mg by mouth as needed.  . clopidogrel (PLAVIX) 75 MG tablet Take 75 mg by mouth once.  . EPINEPHrine 0.3 mg/0.3 mL IJ SOAJ injection Inject 0.3 mLs into the skin once.  . folic acid (FOLVITE) 1 MG tablet Take 1 mg by mouth daily at 12 noon.  . gabapentin (NEURONTIN) 600 MG tablet Take 600 mg by mouth   3 (three) times daily.  Marland Kitchen ketotifen (ZADITOR) 0.025 % ophthalmic solution Place 1 drop into both eyes 2 (two) times daily.  Marland Kitchen lisinopril (PRINIVIL,ZESTRIL) 40 MG tablet Take 40 mg by mouth daily.   . Multiple Vitamins-Iron (MULTIVITAMIN/IRON PO) Take 1 tablet by mouth daily at 12 noon.  . ondansetron (ZOFRAN) 4 MG tablet Take 4 mg by mouth every 8 (eight) hours as needed for nausea/vomiting.  Marland Kitchen oxycodone (OXY-IR) 5 MG capsule Take 5 mg by mouth every 4 (four) hours as needed.  . pantoprazole (PROTONIX) 40 MG tablet Take 40 mg by mouth daily.  . STOOL SOFTENER 100 MG capsule Take 100 mg by mouth daily at 12 noon.  Marland Kitchen tiZANidine (ZANAFLEX) 4 MG tablet Take 4 mg by mouth every 6 (six) hours as needed for muscle spasms.     Allergies: Allergies as of 03/20/2018 - Review Complete  03/20/2018  Allergen Reaction Noted  . Bee venom Anaphylaxis 12/04/2013  . Other Anaphylaxis 03/20/2018  . Acetaminophen Anxiety, Nausea And Vomiting, and Nausea Only 01/08/2016   Past Medical History:  Diagnosis Date  . Coronary artery disease   . Stroke [facility])     Family History: Denied any significant family history.  Social History: Reports that he quit smoking about 4 to 5 years ago when he had a heart attack.  Reports current alcohol use about 2 drinks a day.  Denies any other substance use.  He lives alone at home.  Review of Systems: A complete ROS was negative except as per HPI. Review of Systems  Constitutional: Negative for chills and fever.  HENT: Negative.   Eyes: Negative.   Cardiovascular: Negative.  Negative for chest pain and palpitations.  Gastrointestinal: Positive for nausea (chronic). Negative for abdominal pain and vomiting.  Genitourinary: Negative.   Musculoskeletal: Positive for back pain (chronic) and joint pain (chronic). Negative for myalgias.  Skin: Negative.   Neurological: Negative for dizziness, weakness (right upper and lower extremity) and headaches.  Psychiatric/Behavioral: Negative.      Physical Exam: Blood pressure (!) 145/80, pulse 74, temperature (!) 97.5 F (36.4 C), temperature source Oral, resp. rate 16, SpO2 96 %. Physical Exam  Constitutional: He is well-developed, well-nourished, and in no distress.  HENT:  Head: Normocephalic and atraumatic.  Eyes: Pupils are equal, round, and reactive to light. Conjunctivae and EOM are normal.  Neck: Neck supple. No thyromegaly present.  Pain with ROM  Cardiovascular: Normal rate, regular rhythm and normal heart sounds. Exam reveals no gallop and no friction rub.  No murmur heard. Pulmonary/Chest: Effort normal and breath sounds normal. No respiratory distress. He has no wheezes.  Abdominal: Soft. Bowel sounds are normal. He exhibits no distension.  Musculoskeletal: Normal range of motion.    Neurological: He is alert. No cranial nerve deficit.  CN 2-12 grossly intact, no asterixis Strength: RUE 3/5, RLE 3/5, LUE and LLE 5/5 Reflexes 2+ throughout, sensation intact except for decreased sensation to light touch in right foot  Skin: Skin is warm and dry. He is not diaphoretic. No erythema.  Psychiatric:  Normal mood and affect    EKG: personally reviewed my interpretation is rate of 65, normal sinus rhythm, normal axis, no acute ST changes  CT head: IMPRESSION: No acute intracranial abnormality.  Atrophy, chronic microvascular disease.  Assessment & Plan by Problem: Active Problems:   Gait instability  This is a 62 year old male with a history of CVA with residual right-sided weakness, polyneuropathy, anxiety, CAD (s/p stenting, on ASA and  Plavix), cervical radiculopathy, HTN, CAD, GERD, history of EtOH use(history of withdrawals) and anxiety who presented with new onset right sided weakness.  Found to have a elevated troponin 0.28 and elevated alcohol level.  Right-sided weakness: He has a history of CVA with residual right-sided however he reports that he had worsening right sided weakness. He reports that his lower extremity has gotten much weaker and he was unable to get up out of bed this morning. He has some weakness in the right upper and right lower extremities on exam.  CT head showed no acute intracranial just some atrophy and chronic microvascular changes.  It's possible that this could be related to his EtOH use, but he reports that he has not been increase his usage.  Also possible since he is having more weakness in his lower extremities he is having some worsening radiculopathy. This could also be worsening of his previous stroke however it's unclear what could have exacerbated this.  -MRI head -Lumbar MRI -Telemetry -Continue home aspirin  Troponinemia: Elevated troponin to 0.28. He denies any chest pain. EKG showed no acute findings. EDP discussed with  cardiology who recommended inpatient observation and to trend troponin. -Trend troponins x2 -Monitor for chest pain  Alcohol use: He reports a significant history of alcohol use, he used to be a professional drinker.  He does report that he does not drink very heavily now only about 2 drinks a day. But told ED that he drank up to 1/2 gallon of vodka a day. He denies ever having any history of withdrawals, denies any seizures or tremors or jitteriness.  He appears stable at this time.  LFTs showed an AST 51 and ALT 36. -Telemetry -LR, multivitamins, and thiamine -CIWA with Ativan  Hypertension: History of hypertension, he is on carvedilol, lisinopril at home. -Continue lisinopril 40 mg daily - Continue carvedilol 6.25 twice daily  Chronic back and neck pain: He reports that he is on oxycodone three times a day at home for chronic pain. He reports that he takes about 3 pills a day and sometimes will take up to 4 pills a day when his pain is worse. He ran out of his medications about 2 days ago.  He does not seem to have any withdrawal symptoms at the moment. - OxyIR every 8 hours as needed  -Continue to monitor for any withdrawal symptoms  CAD: History of stents in the past, about 4-5 years ago. He is on clopidogrel and ASA for this.  Denies having any chest pain today.  His troponin was elevated to 0.28 on admission. EKG showed no acute changes.  -Trend troponins for now - Monitor for symptoms -Continue clopidogrel and aspirin  Anxiety: Continue home clonezepam 0.5 mg daily  FEN: LR 100cc/hr, replete lytes prn, regular diet  VTE ppx: Lovenox  Code Status: FULL  Dispo: Admit patient to Inpatient with expected length of stay greater than 2 midnights.  Signed: ,  M, MD 03/20/2018, 2:52 PM  Pager: [phone]  

## 2018-03-21 ENCOUNTER — Inpatient Hospital Stay (HOSPITAL_COMMUNITY): Payer: PPO

## 2018-03-21 DIAGNOSIS — G8929 Other chronic pain: Secondary | ICD-10-CM

## 2018-03-21 DIAGNOSIS — F419 Anxiety disorder, unspecified: Secondary | ICD-10-CM

## 2018-03-21 DIAGNOSIS — R7989 Other specified abnormal findings of blood chemistry: Secondary | ICD-10-CM

## 2018-03-21 DIAGNOSIS — I2510 Atherosclerotic heart disease of native coronary artery without angina pectoris: Secondary | ICD-10-CM

## 2018-03-21 DIAGNOSIS — M48061 Spinal stenosis, lumbar region without neurogenic claudication: Secondary | ICD-10-CM

## 2018-03-21 DIAGNOSIS — Z888 Allergy status to other drugs, medicaments and biological substances status: Secondary | ICD-10-CM

## 2018-03-21 DIAGNOSIS — Z91030 Bee allergy status: Secondary | ICD-10-CM

## 2018-03-21 DIAGNOSIS — F10929 Alcohol use, unspecified with intoxication, unspecified: Secondary | ICD-10-CM

## 2018-03-21 DIAGNOSIS — M5412 Radiculopathy, cervical region: Secondary | ICD-10-CM

## 2018-03-21 DIAGNOSIS — I69351 Hemiplegia and hemiparesis following cerebral infarction affecting right dominant side: Secondary | ICD-10-CM

## 2018-03-21 DIAGNOSIS — Z955 Presence of coronary angioplasty implant and graft: Secondary | ICD-10-CM

## 2018-03-21 DIAGNOSIS — Z7902 Long term (current) use of antithrombotics/antiplatelets: Secondary | ICD-10-CM

## 2018-03-21 DIAGNOSIS — I10 Essential (primary) hypertension: Secondary | ICD-10-CM

## 2018-03-21 DIAGNOSIS — Z91038 Other insect allergy status: Secondary | ICD-10-CM

## 2018-03-21 DIAGNOSIS — G629 Polyneuropathy, unspecified: Secondary | ICD-10-CM

## 2018-03-21 DIAGNOSIS — Z79891 Long term (current) use of opiate analgesic: Secondary | ICD-10-CM

## 2018-03-21 DIAGNOSIS — Z981 Arthrodesis status: Secondary | ICD-10-CM

## 2018-03-21 DIAGNOSIS — Z79899 Other long term (current) drug therapy: Secondary | ICD-10-CM

## 2018-03-21 DIAGNOSIS — M5126 Other intervertebral disc displacement, lumbar region: Secondary | ICD-10-CM

## 2018-03-21 DIAGNOSIS — M4802 Spinal stenosis, cervical region: Secondary | ICD-10-CM

## 2018-03-21 DIAGNOSIS — K219 Gastro-esophageal reflux disease without esophagitis: Secondary | ICD-10-CM

## 2018-03-21 DIAGNOSIS — Z7982 Long term (current) use of aspirin: Secondary | ICD-10-CM

## 2018-03-21 LAB — CBC
HCT: 38.4 % — ABNORMAL LOW (ref 39.0–52.0)
Hemoglobin: 13.2 g/dL (ref 13.0–17.0)
MCH: 34.3 pg — ABNORMAL HIGH (ref 26.0–34.0)
MCHC: 34.4 g/dL (ref 30.0–36.0)
MCV: 99.7 fL (ref 80.0–100.0)
PLATELETS: 187 10*3/uL (ref 150–400)
RBC: 3.85 MIL/uL — AB (ref 4.22–5.81)
RDW: 14.6 % (ref 11.5–15.5)
WBC: 4.8 10*3/uL (ref 4.0–10.5)
nRBC: 0 % (ref 0.0–0.2)

## 2018-03-21 LAB — COMPREHENSIVE METABOLIC PANEL
ALBUMIN: 4.1 g/dL (ref 3.5–5.0)
ALT: 42 U/L (ref 0–44)
AST: 61 U/L — AB (ref 15–41)
Alkaline Phosphatase: 53 U/L (ref 38–126)
Anion gap: 14 (ref 5–15)
BUN: 12 mg/dL (ref 6–20)
CALCIUM: 9.3 mg/dL (ref 8.9–10.3)
CHLORIDE: 98 mmol/L (ref 98–111)
CO2: 27 mmol/L (ref 22–32)
Creatinine, Ser: 1.15 mg/dL (ref 0.61–1.24)
GFR calc non Af Amer: >60 mL/min (ref >60)
GLUCOSE: 97 mg/dL (ref 70–99)
Potassium: 4 mmol/L (ref 3.5–5.1)
SODIUM: 139 mmol/L (ref 135–145)
Total Bilirubin: 1.3 mg/dL — ABNORMAL HIGH (ref 0.3–1.2)
Total Protein: 7 g/dL (ref 6.5–8.1)

## 2018-03-21 LAB — HIV ANTIBODY (ROUTINE TESTING W REFLEX): HIV SCREEN 4TH GENERATION: NONREACTIVE

## 2018-03-21 MED ORDER — LORAZEPAM 1 MG PO TABS
1.0000 mg | ORAL_TABLET | Freq: Once | ORAL | Status: AC
  Administered 2018-03-21: 1 mg via ORAL
  Filled 2018-03-21: qty 1

## 2018-03-21 NOTE — Discharge Instructions (Signed)
Aaron Hicks,   It has been a pleasure working with you and we are glad you're feeling better. You were hospitalized for weakness and alcohol intoxication.   We ran tests to see if your had a new stroke and they all came back negative which is reassuring. We also did some imaging on your lower back and neck which did not show any acute findings, which is also reassuring.  Please attempt to decrease or stop your alcohol use. It seems like this may have contributed to your worsening weakness. Please discuss with your primary care provider about strategies and options to help your quit.  Follow up with your primary care provider in 1-2 weeks  If your symptoms worsen or you develop new symptoms, please seek medical help whether it is your primary care provider or emergency department.  If you have any questions about this hospitalization please call [phone].

## 2018-03-21 NOTE — Care Management Note (Signed)
Case Management Note  Patient Details  Name: Aaron Hicks MRN: [id number] Date of Birth: Sep 11, 1955  Subjective/Objective:       Pt admitted with rt leg weakness. He is from home alone. Pt states he has sisters and daughters that can check on him as needed. Pt is currently active with Outpatient PT at the [facility] in [facility]. Pt has: walker and shower chair at home.  Pt denies issues with obtaining his medications.  Pt drives self.              Action/Plan: Pt discharging home with self care. MD placed orders for [facility] OT. CM met with the patient and he prefers to continue with outpatient therapy. CM called MD and she was in agreement. CM called [facility] and they can also provide Outpatient OT. Orders faxed to [facility] [phone]).  Pt working on transportation home.   Expected Discharge Date:  03/21/18               Expected Discharge Plan:  OP Rehab  In-House Referral:     Discharge planning Services  CM Consult  Post Acute Care Choice:    Choice offered to:     DME Arranged:    DME Agency:     HH Arranged:    HH Agency:     Status of Service:  Completed, signed off  If discussed at [employer] of Stay Meetings, dates discussed:    Additional Comments:  Pollie Friar, RN 03/21/2018, 4:46 PM

## 2018-03-21 NOTE — Evaluation (Signed)
Occupational Therapy Evaluation Patient Details Name: Aaron Hicks MRN: [id number] DOB: 1956-01-23 Today's Date: 03/21/2018    History of Present Illness pt is a 62 y/o male with h/o CVA with residual right-sided weakness,, polyneuropathy, CAD s/p stenting, cervical radiculopathy, HTN, CAD, h/o ETOH use, anxiety, admitted with worsening R-side weakness.  MRI negative for stroke and shows min to mod HNP in lumbar spine.   Clinical Impression   Pt admitted with above dx and now presenting with impaired self-awareness re deficits and slight balance deficits. Pt near baseline with ADLs, but would benefit from [facility] to reduce fall risk and promote maximal return to PLOF. Edu provided re AE/DME use and fall risk, as well as OT indication/services.     Follow Up Recommendations  Home health OT    Equipment Recommendations  None recommended by OT    Recommendations for Other Services Speech consult     Precautions / Restrictions Precautions Precautions: Fall Restrictions Weight Bearing Restrictions: No      Mobility Bed Mobility Overal bed mobility: Modified Independent                Transfers Overall transfer level: Needs assistance Equipment used: Rolling walker (2 wheeled) Transfers: Sit to/from Stand Sit to Stand: Supervision         General transfer comment: use of UE's    Balance Overall balance assessment: Mild deficits observed, not formally tested Sitting-balance support: No upper extremity supported Sitting balance-Leahy Scale: Good Sitting balance - Comments: moves with control outside BOS   Standing balance support: No upper extremity supported;Bilateral upper extremity supported Standing balance-Leahy Scale: Fair                             ADL either performed or assessed with clinical judgement   ADL Overall ADL's : Needs assistance/impaired         Upper Body Bathing: Modified independent   Lower Body Bathing: Modified  independent;Sit to/from stand   Upper Body Dressing : Modified independent   Lower Body Dressing: Modified independent;Sit to/from stand   Toilet Transfer: Modified Independent;RW;Cueing for safety   Toileting- Clothing Manipulation and Hygiene: Modified independent;Sit to/from stand       Functional mobility during ADLs: Supervision/safety;Rolling walker       Vision Baseline Vision/History: No visual deficits Patient Visual Report: No change from baseline Vision Assessment?: No apparent visual deficits            Pertinent Vitals/Pain Pain Assessment: 0-10 Pain Score: 5  Pain Location: back Pain Descriptors / Indicators: Guarding;Discomfort;Radiating;Sore Pain Intervention(s): Monitored during session     Hand Dominance Right   Extremity/Trunk Assessment Upper Extremity Assessment Upper Extremity Assessment: LUE deficits/detail LUE Deficits / Details: Prior shoulder sx with AROM limitations   Lower Extremity Assessment Lower Extremity Assessment: Defer to PT evaluation RLE Deficits / Details: moves in isolation, mild incoordination, grossly 3+/5 to 4- RLE Sensation: history of peripheral neuropathy RLE Coordination: decreased fine motor   Cervical / Trunk Assessment Cervical / Trunk Assessment: Kyphotic   Communication Communication Communication: No difficulties   Cognition Arousal/Alertness: Awake/alert Behavior During Therapy: WFL for tasks assessed/performed Overall Cognitive Status: Within Functional Limits for tasks assessed                                 General Comments: Verbose, limited self awareness  Home Living Family/patient expects to be discharged to:: Private residence Living Arrangements: Alone Available Help at Discharge: Available PRN/intermittently;Family Type of Home: House Home Access: Level entry     Home Layout: One level     Bathroom Shower/Tub: Occupational psychologist: Standard      Home Equipment: Environmental consultant - 2 wheels;Shower seat;Cane - single point          Prior Functioning/Environment Level of Independence: Independent;Independent with assistive device(s)        Comments: often walking without AD, canes kept close by.        OT Problem List: Impaired balance (sitting and/or standing);Decreased safety awareness;Decreased knowledge of use of DME or AE;Pain      OT Treatment/Interventions: Self-care/ADL training;Therapeutic exercise;Balance training;Therapeutic activities;DME and/or AE instruction;Energy conservation    OT Goals(Current goals can be found in the care plan section) Acute Rehab OT Goals Patient Stated Goal: home and independent OT Goal Formulation: With patient Time For Goal Achievement: 03/28/18 Potential to Achieve Goals: Good  OT Frequency: Min 2X/week    AM-PAC PT "6 Clicks" Daily Activity     Outcome Measure Help from another person eating meals?: None Help from another person taking care of personal grooming?: None Help from another person toileting, which includes using toliet, bedpan, or urinal?: A Little Help from another person bathing (including washing, rinsing, drying)?: None Help from another person to put on and taking off regular upper body clothing?: None Help from another person to put on and taking off regular lower body clothing?: None 6 Click Score: 23   End of Session Equipment Utilized During Treatment: Gait belt;Rolling walker  Activity Tolerance: Patient tolerated treatment well Patient left: in bed;with call bell/phone within reach  OT Visit Diagnosis: Repeated falls (R29.6);History of falling (Z91.81)                Time: 3790-2409 OT Time Calculation (min): 24 min Charges:  OT General Charges $OT Visit: 1 Visit OT Evaluation $OT Eval Low Complexity: 1 Low OT Treatments $Therapeutic Activity: 8-22 mins  Briyan Sites OTR/L 03/21/2018, 1:53 PM

## 2018-03-21 NOTE — Progress Notes (Signed)
Date: 03/21/2018  Patient name: Aaron Hicks  Medical record number: [id number]  Date of birth: 1955-07-02   I have seen and evaluated Haydee Salter and discussed their care with the Residency Team.  In brief, patient is a 62 year old male with a past medical history of CVA with residual right-sided weakness, anxiety, polyneuropathy, CAD status post stents, cervical radiculopathy, hypertension, alcohol use, GERD who presented to the ED with worsening of his right-sided weakness.  Patient states that yesterday morning when he woke up he noted significantly worsened weakness on the right side especially his right lower extremity and had difficulty ambulating.  No fevers or chills, no chest pain, no shortness of breath, palpitations, lightheadedness, syncope, no headaches, no blurry vision, no abdominal pain, no nausea or vomiting.  Patient came to the ED for further evaluation.  Patient states that he normally takes oxycodone for his chronic pain but he ran out of this approximately 2 days ago and drank alcohol to help relieve his pain.  He denies any history of alcohol withdrawal or seizures.  His last drink was yesterday morning.  Today patient states that his weakness appears to be a little better.  He was able to ambulate using a walker.  PMHx, Fam Hx, and/or Soc Hx : As per resident admit note  Vitals:   03/21/18 0817 03/21/18 1149  BP: (!) 148/109 (!) 151/107  Pulse: (!) 117 82  Resp: 20 17  Temp: 98.6 F (37 C) 99.5 F (37.5 C)  SpO2: 97% 95%   General: Awake, alert, oriented x3, NAD CVS: Regular rate and rhythm, normal heart sounds Lungs: CTA bilaterally Abdomen: Soft, nontender, nondistended, normoactive bowel sounds Extremities: No edema noted Neuro: Strength 4 out of 5 in his right upper and lower extremities and 5 out of 5 in his left upper and lower extremities, decreased sensation in his right lower extremity compared to his left.  Assessment and Plan: I have seen and  evaluated the patient as outlined above. I agree with the formulated Assessment and Plan as detailed in the residents' note, with the following changes:   1.  Worsening right-sided weakness: -Patient was initially admitted to the hospital with worsening right-sided weakness especially in his right lower extremity which was concerning for a recurrent CVA.  His work-up including a CT head and MRI of brain were negative for acute CVA.  He also had a lumbar MRI given his worsening right lower extremity weakness which showed L4-L5 central disc extrusion with moderate spinal canal stenosis but no acute cord compression.  I am uncertain as to the etiology of his worsening weakness but it may be secondary to his increased alcohol use over the last couple of days.  His weakness appears to be improving on exam today as well as subjectively. -We obtain an MRI of his C-spine as well given his history of cervical fusion which showed moderately severe bilateral foraminal canal narrowing at C2-C3 which is worse on the right side.  The central canal was opened at all levels. -Patient will need outpatient follow-up with neurosurgery for his findings on his MRI but given his improving symptoms and no evidence of cord compression I do not believe that he needs an urgent surgical intervention at this time -We will continue with aspirin for now -Patient was also noted to have mildly elevated troponins which peaked at 0.28.  I suspect that this is demand ischemia in the setting of a history of CAD  -EKG with no acute ST/T  wave changes -No further work-up at this time. -Patient stable for discharge home today.  Aldine Contes, MD 10/17/20195:24 PM

## 2018-03-21 NOTE — Plan of Care (Signed)

## 2018-03-21 NOTE — Discharge Summary (Signed)
Name: Aaron Hicks MRN: [id number] DOB: 24-Jun-1955 63 y.o. PCP: System, Pcp Not In  Date of Admission: 03/20/2018  9:19 AM Date of Discharge: 03/21/2018 03/22/18 Attending Physician: Aldine Contes  Discharge Diagnosis: 1. Worsening right sided weakness 2. EtOH use 3. Troponemia  Discharge Medications: Allergies as of 03/21/2018      Reactions   Bee Venom Anaphylaxis   Bee/Wasp Stings. Bee/Wasp Stings   Other Anaphylaxis   Bee/Wasp Stings. Bee/Wasp Stings   Acetaminophen Anxiety, Nausea And Vomiting, Nausea Only      Medication List    TAKE these medications   aspirin EC 81 MG tablet Take 81 mg by mouth daily at 12 noon.   atorvastatin 20 MG tablet Commonly known as:  LIPITOR Take 20 mg by mouth daily.   carvedilol 6.25 MG tablet Commonly known as:  COREG Take 6.25 mg by mouth.   clonazePAM 0.5 MG disintegrating tablet Commonly known as:  KLONOPIN Take 0.5 mg by mouth as needed.   clopidogrel 75 MG tablet Commonly known as:  PLAVIX Take 75 mg by mouth once.   EPINEPHrine 0.3 mg/0.3 mL Soaj injection Commonly known as:  EPI-PEN Inject 0.3 mLs into the skin once.   folic acid 1 MG tablet Commonly known as:  FOLVITE Take 1 mg by mouth daily at 12 noon.   gabapentin 600 MG tablet Commonly known as:  NEURONTIN Take 600 mg by mouth 3 (three) times daily.   ketotifen 0.025 % ophthalmic solution Commonly known as:  ZADITOR Place 1 drop into both eyes 2 (two) times daily.   lisinopril 40 MG tablet Commonly known as:  PRINIVIL,ZESTRIL Take 40 mg by mouth daily.   MULTIVITAMIN/IRON PO Take 1 tablet by mouth daily at 12 noon.   ondansetron 4 MG tablet Commonly known as:  ZOFRAN Take 4 mg by mouth every 8 (eight) hours as needed for nausea/vomiting.   oxycodone 5 MG capsule Commonly known as:  OXY-IR Take 5 mg by mouth every 4 (four) hours as needed.   pantoprazole 40 MG tablet Commonly known as:  PROTONIX Take 40 mg by mouth daily.   STOOL  SOFTENER 100 MG capsule Generic drug:  docusate sodium Take 100 mg by mouth daily at 12 noon.   tiZANidine 4 MG tablet Commonly known as:  ZANAFLEX Take 4 mg by mouth every 6 (six) hours as needed for muscle spasms.       Disposition and follow-up:   Aaron Hicks was discharged from [facility] in Good condition.  At the hospital follow up visit please address:  1.  Right sided weakness: CT head and MRI showed no acute findings. Lumbar MRI showed L4-L5 central disc extrusion with moderate spinal canal stenosis, no acute cord compression. Weakness improved significantly the next day. Not thought to be a recurrent stroke, possibly from increased EtOH use.   EtOH use: Discuss cessation options.  Chronic neck pain: History of cervical fusion, cervical C-spine showed moderately severe bilateral foraminal canal narrowing at C2-C3. Will likely need outpatient follow up with neurosurgery.  2.  Labs / imaging needed at time of follow-up: None  3.  Pending labs/ test needing follow-up: None  Follow-up Appointments: Follow-up [place] Follow up.   Why:  They will contact you for the first appointment Contact information:  [address] [place], [place] [address]  [phone]          Hospital Course by problem list: 1. Worsening  right sided weakness: This is a 62 year old male with a history ofCVA with residual right-sided weakness, polyneuropathy,anxiety,CAD (s/p stenting, on ASA and Plavix),cervical radiculopathy, HTN, CAD, GERD,historyof EtOH use(history of withdrawals) andanxietywho presented with new onset right sided weakness.  He reported that his lower extremity got significantly weaker that morning.  Found to have weakness in right upper and right lower extremity.  CT head showed no acute intracranial abnormalities, chest atrophy and chronic microvascular changes.  MRI showed old pontine infarcts and age-related volume loss, lumbar  MRI showed L4-L5 central disc extrusion, with central migration causing moderate spinal canal stenosis, and L3-L4 diffuse disc bulge.  Patient significantly improved the next day.  Is possible that his symptoms are related to his increased alcohol intake.  2. EtOH use: He reports a significant history of alcohol use.  He does report that he does not drink very heavily now only about 2 drinks a day. But told ED that he drank up to 1/2 gallon of vodka a day. He denies ever having any history of withdrawals, denies any seizures or tremors or jitteriness. LFTs showed an AST 51 and ALT 36. He was placed on CIWA protocol and received 3 doses the first night with CIWAs of up to 9. Was HD on discharge.   3. Troponemia: History of CAD (s/p stenting, on ASA and Plavix). Patient denied any chest pain admission. Troponins were 0.28 > 0.28 > 0.22.  EKG showed no acute ST changes.  He did not have any chest pain during admission.  Discharge Vitals:   BP (!) 151/98 (BP Location: Left Arm) Comment: RN Notified  Pulse 80   Temp 98.5 F (36.9 C) (Oral)   Resp 20   SpO2 98%   Pertinent Labs, Studies, and Procedures:  CBC Latest Ref Rng & Units 03/21/2018 03/20/2018  WBC 4.0 - 10.5 K/uL 4.8 4.6  Hemoglobin 13.0 - 17.0 g/dL 13.2 13.3  Hematocrit 39.0 - 52.0 % 38.4(L) 41.7  Platelets 150 - 400 K/uL 187 227   CMP Latest Ref Rng & Units 03/21/2018 03/20/2018  Glucose 70 - 99 mg/dL 97 73  BUN 6 - 20 mg/dL 12 13  Creatinine 0.61 - 1.24 mg/dL 1.15 1.11  Sodium 135 - 145 mmol/L 139 139  Potassium 3.5 - 5.1 mmol/L 4.0 4.4  Chloride 98 - 111 mmol/L 98 100  CO2 22 - 32 mmol/L 27 16(L)  Calcium 8.9 - 10.3 mg/dL 9.3 8.9  Total Protein 6.5 - 8.1 g/dL 7.0 7.0  Total Bilirubin 0.3 - 1.2 mg/dL 1.3(H) 1.0  Alkaline Phos 38 - 126 U/L 53 44  AST 15 - 41 U/L 61(H) 51(H)  ALT 0 - 44 U/L 42 36   Ethanol: 296 UDS: Negative  Troponin: 0.28 > 0.28 > 0.22 B12:459 A1c: 6.0  CT head: IMPRESSION: No acute intracranial  abnormality. Atrophy, chronic microvascular disease.  Brain MRI without contrast: IMPRESSION: 1. No acute abnormality. 2. Old pontine infarcts and age-advanced volume loss.   Lumbar MRI: IMPRESSION: 1. Intermediate sized L4-5 central disc extrusion with superior migration causing moderate spinal canal stenosis. 2. L3-L4 diffuse disc bulge causing mild spinal canal stenosis.   Cervical MRI: IMPRESSION: Advanced bilateral facet degenerative disease with some uncovertebral spurring cause moderate to moderately severe bilateral foraminal narrowing at C2-3, worse on the right.  Status post C3-7 fusion. The central canal is open at all levels. Uncovertebral spurring causes scattered foraminal narrowing at these levels which appears worst bilaterally at C3-4, and on the left at  C5-6 and C6-7.  Discharge Instructions: Discharge Instructions    Ambulatory referral to Occupational Therapy   Complete by:  As directed    Ambulatory referral to Physical Therapy   Complete by:  As directed    Call MD for:  difficulty breathing, headache or visual disturbances   Complete by:  As directed    Call MD for:  extreme fatigue   Complete by:  As directed    Call MD for:  hives   Complete by:  As directed    Call MD for:  persistant dizziness or light-headedness   Complete by:  As directed    Call MD for:  persistant nausea and vomiting   Complete by:  As directed    Call MD for:  redness, tenderness, or signs of infection (pain, swelling, redness, odor or green/yellow discharge around incision site)   Complete by:  As directed    Call MD for:  severe uncontrolled pain   Complete by:  As directed    Call MD for:  temperature >100.4   Complete by:  As directed    Diet - low sodium heart healthy   Complete by:  As directed    Discharge instructions   Complete by:  As directed    Haydee Salter,   It has been a pleasure working with you and we are glad you're feeling better. You were  hospitalized for weakness and alcohol intoxication.   We ran tests to see if your had a new stroke and they all came back negative which is reassuring. We also did some imaging on your lower back and neck which did not show any acute findings, which is also reassuring.  Please attempt to decrease or stop your alcohol use. It seems like this may have contributed to your worsening weakness. Please discuss with your primary care provider about strategies and options to help your quit.  Follow up with your primary care provider in 1-2 weeks  If your symptoms worsen or you develop new symptoms, please seek medical help whether it is your primary care provider or emergency department.  If you have any questions about this hospitalization please call [phone].   Increase activity slowly   Complete by:  As directed       Signed: Asencion Noble, MD 03/22/2018, 3:34 PM   Pager: [phone]

## 2018-03-21 NOTE — Evaluation (Signed)
Physical Therapy Evaluation Patient Details Name: Aaron Hicks MRN: [id number] DOB: 02-Jul-1955 Today's Date: 03/21/2018   History of Present Illness  pt is a 62 y/o male with h/o CVA with residual right-sided weakness,, polyneuropathy, CAD s/p stenting, cervical radiculopathy, HTN, CAD, h/o ETOH use, anxiety, admitted with worsening R-side weakness.  MRI negative for stroke and shows min to mod HNP in lumbar spine.  Clinical Impression  Pt admitted with/for r-sided weakness that is not at baseline, but slowly improving.  Pt currently limited functionally due to the problems listed below.  (see problems list.)  Pt will benefit from PT to maximize function and safety to be able to get home safely with available assist.     Follow Up Recommendations Outpatient PT;Supervision - Intermittent    Equipment Recommendations  None recommended by PT    Recommendations for Other Services       Precautions / Restrictions Precautions Precautions: Fall      Mobility  Bed Mobility Overal bed mobility: Modified Independent                Transfers Overall transfer level: Needs assistance   Transfers: Sit to/from Stand Sit to Stand: Supervision         General transfer comment: use of UE's  Ambulation/Gait Ambulation/Gait assistance: Supervision Gait Distance (Feet): 350 Feet Assistive device: Rolling walker (2 wheeled) Gait Pattern/deviations: Step-through pattern Gait velocity: moderate Gait velocity interpretation: 1.31 - 2.62 ft/sec, indicative of limited community ambulator General Gait Details: generally steady, but with uncoordinated right heel toe pattern,  with mild circumduction.  Stairs            Wheelchair Mobility    Modified Rankin (Stroke Patients Only)       Balance Overall balance assessment: Needs assistance Sitting-balance support: No upper extremity supported Sitting balance-Leahy Scale: Good Sitting balance - Comments: moves with  control outside BOS   Standing balance support: No upper extremity supported;Bilateral upper extremity supported Standing balance-Leahy Scale: Fair                               Pertinent Vitals/Pain Pain Assessment: 0-10 Pain Score: 7  Pain Location: back Pain Descriptors / Indicators: Guarding;Discomfort;Radiating;Sore Pain Intervention(s): Monitored during session;Premedicated before session    [place] expects to be discharged to:: Private residence Living Arrangements: Alone Available Help at Discharge: Available PRN/intermittently;Family Type of Home: House(condo) Home Access: Level entry     Home Layout: One level Home Equipment: Walker - 2 wheels;Cane - single point      Prior Function Level of Independence: Independent;Independent with assistive device(s)         Comments: often walking without AD, canes kept close by.     Hand Dominance        Extremity/Trunk Assessment   Upper Extremity Assessment Upper Extremity Assessment: Overall WFL for tasks assessed(L UE gross extension and shoulder flexion/abd 3 to 3+)    Lower Extremity Assessment Lower Extremity Assessment: RLE deficits/detail RLE Deficits / Details: moves in isolation, mild incoordination, grossly 3+/5 to 4- RLE Sensation: history of peripheral neuropathy RLE Coordination: decreased fine motor    Cervical / Trunk Assessment Cervical / Trunk Assessment: Kyphotic  Communication   Communication: No difficulties  Cognition Arousal/Alertness: Awake/alert Behavior During Therapy: WFL for tasks assessed/performed Overall Cognitive Status: Within Functional Limits for tasks assessed  General Comments      Exercises     Assessment/Plan    PT Assessment Patient needs continued PT services  PT Problem List Decreased strength;Decreased balance;Decreased mobility;Pain       PT Treatment Interventions  Functional mobility training;Therapeutic activities;Gait training;DME instruction;Balance training;Patient/family education    PT Goals (Current goals can be found in the Care Plan section)  Acute Rehab PT Goals Patient Stated Goal: home and independent PT Goal Formulation: With patient Time For Goal Achievement: 03/28/18 Potential to Achieve Goals: Good    Frequency Min 3X/week   Barriers to discharge        Co-evaluation               AM-PAC PT "6 Clicks" Daily Activity  Outcome Measure Difficulty turning over in bed (including adjusting bedclothes, sheets and blankets)?: None Difficulty moving from lying on back to sitting on the side of the bed? : None Difficulty sitting down on and standing up from a chair with arms (e.g., wheelchair, bedside commode, etc,.)?: None Help needed moving to and from a bed to chair (including a wheelchair)?: A Little Help needed walking in hospital room?: A Little Help needed climbing 3-5 steps with a railing? : A Little 6 Click Score: 21    End of Session   Activity Tolerance: Patient tolerated treatment well Patient left: in bed;with call bell/phone within reach Nurse Communication: Mobility status PT Visit Diagnosis: Unsteadiness on feet (R26.81)    Time: 2831-5176 PT Time Calculation (min) (ACUTE ONLY): 34 min   Charges:   PT Evaluation $PT Eval Low Complexity: 1 Low PT Treatments $Gait Training: 8-22 mins        03/21/2018  Donnella Sham, PT Acute Rehabilitation Services [phone]  (pager) [phone]  (office)  Tessie Fass Shlonda Dolloff 03/21/2018, 11:29 AM

## 2018-03-21 NOTE — Progress Notes (Signed)
Patient discharged in stable condition with all belongings. He verbalized understanding of all discharge instructions and importance of follow up appointments.

## 2018-03-21 NOTE — Progress Notes (Signed)
Subjective: Mr. Aaron Hicks was well today.  He reports that his right upper and right lower extremity strength is improved.  Is now able to stand up and walk to the bathroom using a walker.  He is very concerned about his neck pain and was wondering if it could be related to his surgery that he had. He denies any new issues today. We discussed the plan for today and he is in agreement.   Objective:  Vital signs in last 24 hours: Vitals:   03/20/18 1526 03/20/18 2103 03/20/18 2332 03/21/18 0353  BP: (!) 125/99 106/70 140/87 (!) 178/101  Pulse: 76 82 79 64  Resp: 19 18 18 18   Temp:  98.8 F (37.1 C) 98.5 F (36.9 C) 98.3 F (36.8 C)  TempSrc:  Oral Oral Oral  SpO2: 92% 92% 93% 96%    General: Well nourished, well appearing, NAD  Cardiac: RRR, normal S1, S2, no murmurs, rubs or gallops  Pulmonary: Lungs CTA bilaterally, no wheezing, rhonchi or rales  Extremity: No LE edema, no muscle atrophy, no lesions or wounds noted  Neuro: Alert, CN 2-12 intact, Strength: 4/5 in RUE and RLE, 5/5 in LLE and LUE, sensation decreased in RLE extremity Psychiatry: Normal mood and affect     Assessment/Plan:  Active Problems:   Right leg weakness  This is a 62 year old male with a history of CVA with residual right-sided weakness, polyneuropathy, anxiety, CAD (s/p stenting, on ASA and Plavix), cervical radiculopathy, HTN, CAD, GERD, history of EtOH use(history of withdrawals) and anxiety who presented with new onset right sided weakness.  Found to have a elevated troponin 0.28 and elevated alcohol level.  Right-sided weakness: Given his history of CVA with residual right-sided weakness and alcohol use his worsening symptoms could be related to worsening of his previous CVA. CT head showed no acute intracranial just some atrophy and chronic microvascular changes.  Since he had some more lower extremity weakness and has a history of back pain there was concern about possible cord compression. Lumbar MRI  showed L4-L5 central disc extrusion, with central migration causing moderate spinal canal stenosis, and L3-L4 diffuse disc bulge.  MRI brain showed vomiting and age-related volume loss, no acute findings.  It is possible that is just worsening of his previous CVA possibly secondary to increased alcohol use.  He has had some increased strength today compared to yesterday. -MRI head: Showed old pontine infarcts and age related volume loss. -Lumbar MRI: L4-L5 central disc extrusion, with central migration causing moderate spinal canal stenosis, and L3-L4 diffuse disc bulge. -D/c telemetry -Continue home aspirin  Troponinemia: Elevated troponin to 0.28. He denies any chest pain.  EDP discussed with cardiology and inpatient patient. -Troponins resulted at 0.28 > 0.28 > 0.22 -Monitor for chest pain  Alcohol use:  Vitals have been stable today, he is on CIWA protocol and has received 3 doses of Ativan.  AST 61 ALT 42.  Last drink was yesterday morning, continue CIWA for now. -Telemetry - s/p LR, multivitamins, and thiamine -Discontinue CIWA with Ativan.  Hypertension: History of hypertension, he is on carvedilol, lisinopril at home.  One episode of hypertension this morning. -Continue lisinopril 40 mg daily - Continue carvedilol 6.25 twice daily  Chronic back and neck pain:  Today he reports he has been having more neck pain is concerned about his previous surgery.   -Neck MRI - OxyIR every 8 hours as needed  -Continue to monitor for any withdrawal symptoms  CAD: History of  stents in the past, about 4-5 days ago. He is on clopidogrel and ASA for this.  Denies having any rest pain today.  His troponin was elevated to 0.28 on admission. EKG showed no acute changes.  -Prominence stable - Monitor for symptoms -Continue clopidogrel and aspirin  Anxiety: Continue home clonezepam 0.5 mg daily  FEN: LR 100cc/hr, replete lytes prn, regular diet  VTE ppx: Lovenox  Code Status: FULL  Dispo:  Anticipated discharge in approximately today.   Asencion Noble, MD 03/21/2018, 6:27 AM Pager: [phone]

## 2018-03-22 DIAGNOSIS — G894 Chronic pain syndrome: Secondary | ICD-10-CM | POA: Diagnosis not present

## 2018-03-22 DIAGNOSIS — F1190 Opioid use, unspecified, uncomplicated: Secondary | ICD-10-CM | POA: Diagnosis not present

## 2018-03-22 DIAGNOSIS — Z79899 Other long term (current) drug therapy: Secondary | ICD-10-CM | POA: Diagnosis not present

## 2018-04-02 DIAGNOSIS — M545 Low back pain: Secondary | ICD-10-CM | POA: Diagnosis not present

## 2018-04-02 DIAGNOSIS — R0602 Shortness of breath: Secondary | ICD-10-CM | POA: Diagnosis not present

## 2018-04-02 DIAGNOSIS — S301XXA Contusion of abdominal wall, initial encounter: Secondary | ICD-10-CM | POA: Diagnosis not present

## 2018-04-02 DIAGNOSIS — F10929 Alcohol use, unspecified with intoxication, unspecified: Secondary | ICD-10-CM | POA: Diagnosis not present

## 2018-04-02 DIAGNOSIS — S6992XA Unspecified injury of left wrist, hand and finger(s), initial encounter: Secondary | ICD-10-CM | POA: Diagnosis not present

## 2018-04-02 DIAGNOSIS — S3992XA Unspecified injury of lower back, initial encounter: Secondary | ICD-10-CM | POA: Diagnosis not present

## 2018-04-02 DIAGNOSIS — G9389 Other specified disorders of brain: Secondary | ICD-10-CM | POA: Diagnosis not present

## 2018-04-02 DIAGNOSIS — F10129 Alcohol abuse with intoxication, unspecified: Secondary | ICD-10-CM | POA: Diagnosis not present

## 2018-04-02 DIAGNOSIS — S0003XA Contusion of scalp, initial encounter: Secondary | ICD-10-CM | POA: Diagnosis not present

## 2018-04-02 DIAGNOSIS — W1839XA Other fall on same level, initial encounter: Secondary | ICD-10-CM | POA: Diagnosis not present

## 2018-04-02 DIAGNOSIS — S40012A Contusion of left shoulder, initial encounter: Secondary | ICD-10-CM | POA: Diagnosis not present

## 2018-04-02 DIAGNOSIS — E162 Hypoglycemia, unspecified: Secondary | ICD-10-CM | POA: Diagnosis not present

## 2018-04-02 DIAGNOSIS — Y92009 Unspecified place in unspecified non-institutional (private) residence as the place of occurrence of the external cause: Secondary | ICD-10-CM | POA: Diagnosis not present

## 2018-04-02 DIAGNOSIS — S299XXA Unspecified injury of thorax, initial encounter: Secondary | ICD-10-CM | POA: Diagnosis not present

## 2018-04-02 DIAGNOSIS — R0902 Hypoxemia: Secondary | ICD-10-CM | POA: Diagnosis not present

## 2018-04-02 DIAGNOSIS — S0083XA Contusion of other part of head, initial encounter: Secondary | ICD-10-CM | POA: Diagnosis not present

## 2018-04-02 DIAGNOSIS — M542 Cervicalgia: Secondary | ICD-10-CM | POA: Diagnosis not present

## 2018-04-02 DIAGNOSIS — Z981 Arthrodesis status: Secondary | ICD-10-CM | POA: Diagnosis not present

## 2018-04-02 DIAGNOSIS — M5489 Other dorsalgia: Secondary | ICD-10-CM | POA: Diagnosis not present

## 2018-04-02 DIAGNOSIS — S60222A Contusion of left hand, initial encounter: Secondary | ICD-10-CM | POA: Diagnosis not present

## 2018-04-02 DIAGNOSIS — G8929 Other chronic pain: Secondary | ICD-10-CM | POA: Diagnosis not present

## 2018-04-02 DIAGNOSIS — Y999 Unspecified external cause status: Secondary | ICD-10-CM | POA: Diagnosis not present

## 2018-04-02 DIAGNOSIS — S199XXA Unspecified injury of neck, initial encounter: Secondary | ICD-10-CM | POA: Diagnosis not present

## 2018-04-02 DIAGNOSIS — E161 Other hypoglycemia: Secondary | ICD-10-CM | POA: Diagnosis not present

## 2018-04-03 DIAGNOSIS — R0602 Shortness of breath: Secondary | ICD-10-CM | POA: Diagnosis not present

## 2018-04-08 DIAGNOSIS — R296 Repeated falls: Secondary | ICD-10-CM | POA: Diagnosis not present

## 2018-04-08 DIAGNOSIS — G894 Chronic pain syndrome: Secondary | ICD-10-CM | POA: Diagnosis not present

## 2018-04-08 DIAGNOSIS — I10 Essential (primary) hypertension: Secondary | ICD-10-CM | POA: Diagnosis not present

## 2018-04-08 DIAGNOSIS — T148XXA Other injury of unspecified body region, initial encounter: Secondary | ICD-10-CM | POA: Diagnosis not present

## 2018-04-10 DIAGNOSIS — Z113 Encounter for screening for infections with a predominantly sexual mode of transmission: Secondary | ICD-10-CM | POA: Diagnosis not present

## 2018-04-10 DIAGNOSIS — Z1331 Encounter for screening for depression: Secondary | ICD-10-CM | POA: Diagnosis not present

## 2018-04-10 DIAGNOSIS — M545 Low back pain: Secondary | ICD-10-CM | POA: Diagnosis not present

## 2018-04-10 DIAGNOSIS — Z0000 Encounter for general adult medical examination without abnormal findings: Secondary | ICD-10-CM | POA: Diagnosis not present

## 2018-04-10 DIAGNOSIS — E7800 Pure hypercholesterolemia, unspecified: Secondary | ICD-10-CM | POA: Diagnosis not present

## 2018-04-10 DIAGNOSIS — Z79899 Other long term (current) drug therapy: Secondary | ICD-10-CM | POA: Diagnosis not present

## 2018-04-10 DIAGNOSIS — M542 Cervicalgia: Secondary | ICD-10-CM | POA: Diagnosis not present

## 2018-04-10 DIAGNOSIS — Z131 Encounter for screening for diabetes mellitus: Secondary | ICD-10-CM | POA: Diagnosis not present

## 2018-04-10 DIAGNOSIS — M25561 Pain in right knee: Secondary | ICD-10-CM | POA: Diagnosis not present

## 2018-04-10 DIAGNOSIS — E559 Vitamin D deficiency, unspecified: Secondary | ICD-10-CM | POA: Diagnosis not present

## 2018-04-10 DIAGNOSIS — I10 Essential (primary) hypertension: Secondary | ICD-10-CM | POA: Diagnosis not present

## 2018-04-10 DIAGNOSIS — Z1339 Encounter for screening examination for other mental health and behavioral disorders: Secondary | ICD-10-CM | POA: Diagnosis not present

## 2018-04-10 DIAGNOSIS — M549 Dorsalgia, unspecified: Secondary | ICD-10-CM | POA: Diagnosis not present

## 2018-04-10 DIAGNOSIS — R0602 Shortness of breath: Secondary | ICD-10-CM | POA: Diagnosis not present

## 2018-04-10 DIAGNOSIS — M129 Arthropathy, unspecified: Secondary | ICD-10-CM | POA: Diagnosis not present

## 2018-04-10 DIAGNOSIS — M25551 Pain in right hip: Secondary | ICD-10-CM | POA: Diagnosis not present

## 2018-04-23 DIAGNOSIS — M545 Low back pain: Secondary | ICD-10-CM | POA: Diagnosis not present

## 2018-04-23 DIAGNOSIS — R945 Abnormal results of liver function studies: Secondary | ICD-10-CM | POA: Diagnosis not present

## 2018-04-23 DIAGNOSIS — I10 Essential (primary) hypertension: Secondary | ICD-10-CM | POA: Diagnosis not present

## 2018-04-23 DIAGNOSIS — E559 Vitamin D deficiency, unspecified: Secondary | ICD-10-CM | POA: Diagnosis not present

## 2018-04-29 DIAGNOSIS — G8929 Other chronic pain: Secondary | ICD-10-CM | POA: Diagnosis not present

## 2018-04-29 DIAGNOSIS — M25551 Pain in right hip: Secondary | ICD-10-CM | POA: Diagnosis not present

## 2018-04-29 DIAGNOSIS — M545 Low back pain: Secondary | ICD-10-CM | POA: Diagnosis not present

## 2018-04-29 DIAGNOSIS — Z79899 Other long term (current) drug therapy: Secondary | ICD-10-CM | POA: Diagnosis not present

## 2018-04-29 DIAGNOSIS — M542 Cervicalgia: Secondary | ICD-10-CM | POA: Diagnosis not present

## 2018-05-13 DIAGNOSIS — Z79899 Other long term (current) drug therapy: Secondary | ICD-10-CM | POA: Diagnosis not present

## 2018-05-13 DIAGNOSIS — M545 Low back pain: Secondary | ICD-10-CM | POA: Diagnosis not present

## 2018-05-13 DIAGNOSIS — G8929 Other chronic pain: Secondary | ICD-10-CM | POA: Diagnosis not present

## 2018-05-13 DIAGNOSIS — F419 Anxiety disorder, unspecified: Secondary | ICD-10-CM | POA: Diagnosis not present

## 2018-06-10 DIAGNOSIS — H527 Unspecified disorder of refraction: Secondary | ICD-10-CM | POA: Diagnosis not present

## 2018-06-17 DIAGNOSIS — Z79899 Other long term (current) drug therapy: Secondary | ICD-10-CM | POA: Diagnosis not present

## 2018-06-17 DIAGNOSIS — F1120 Opioid dependence, uncomplicated: Secondary | ICD-10-CM | POA: Diagnosis not present

## 2018-06-17 DIAGNOSIS — M545 Low back pain: Secondary | ICD-10-CM | POA: Diagnosis not present

## 2018-06-17 DIAGNOSIS — G8929 Other chronic pain: Secondary | ICD-10-CM | POA: Diagnosis not present

## 2018-07-02 DIAGNOSIS — K648 Other hemorrhoids: Secondary | ICD-10-CM | POA: Diagnosis not present

## 2018-07-02 DIAGNOSIS — D123 Benign neoplasm of transverse colon: Secondary | ICD-10-CM | POA: Diagnosis not present

## 2018-07-02 DIAGNOSIS — K5730 Diverticulosis of large intestine without perforation or abscess without bleeding: Secondary | ICD-10-CM | POA: Diagnosis not present

## 2018-07-02 DIAGNOSIS — D124 Benign neoplasm of descending colon: Secondary | ICD-10-CM | POA: Diagnosis not present

## 2018-07-02 DIAGNOSIS — Z1211 Encounter for screening for malignant neoplasm of colon: Secondary | ICD-10-CM | POA: Diagnosis not present

## 2018-07-02 DIAGNOSIS — K621 Rectal polyp: Secondary | ICD-10-CM | POA: Diagnosis not present

## 2018-07-02 DIAGNOSIS — D128 Benign neoplasm of rectum: Secondary | ICD-10-CM | POA: Diagnosis not present

## 2018-07-17 DIAGNOSIS — R0602 Shortness of breath: Secondary | ICD-10-CM | POA: Diagnosis not present

## 2018-07-17 DIAGNOSIS — M129 Arthropathy, unspecified: Secondary | ICD-10-CM | POA: Diagnosis not present

## 2018-07-17 DIAGNOSIS — Z79899 Other long term (current) drug therapy: Secondary | ICD-10-CM | POA: Diagnosis not present

## 2018-07-17 DIAGNOSIS — G8929 Other chronic pain: Secondary | ICD-10-CM | POA: Diagnosis not present

## 2018-07-17 DIAGNOSIS — R7303 Prediabetes: Secondary | ICD-10-CM | POA: Diagnosis not present

## 2018-07-17 DIAGNOSIS — E559 Vitamin D deficiency, unspecified: Secondary | ICD-10-CM | POA: Diagnosis not present

## 2018-07-17 DIAGNOSIS — M545 Low back pain: Secondary | ICD-10-CM | POA: Diagnosis not present

## 2018-07-17 DIAGNOSIS — D539 Nutritional anemia, unspecified: Secondary | ICD-10-CM | POA: Diagnosis not present

## 2018-07-17 DIAGNOSIS — E7800 Pure hypercholesterolemia, unspecified: Secondary | ICD-10-CM | POA: Diagnosis not present

## 2018-08-01 DIAGNOSIS — L570 Actinic keratosis: Secondary | ICD-10-CM | POA: Diagnosis not present

## 2018-08-01 DIAGNOSIS — L821 Other seborrheic keratosis: Secondary | ICD-10-CM | POA: Diagnosis not present

## 2018-08-01 DIAGNOSIS — D225 Melanocytic nevi of trunk: Secondary | ICD-10-CM | POA: Diagnosis not present

## 2018-08-01 DIAGNOSIS — D1801 Hemangioma of skin and subcutaneous tissue: Secondary | ICD-10-CM | POA: Diagnosis not present

## 2018-08-01 DIAGNOSIS — L918 Other hypertrophic disorders of the skin: Secondary | ICD-10-CM | POA: Diagnosis not present

## 2018-08-08 DIAGNOSIS — I10 Essential (primary) hypertension: Secondary | ICD-10-CM | POA: Diagnosis not present

## 2018-08-08 DIAGNOSIS — I2510 Atherosclerotic heart disease of native coronary artery without angina pectoris: Secondary | ICD-10-CM | POA: Diagnosis not present

## 2018-08-08 DIAGNOSIS — E7800 Pure hypercholesterolemia, unspecified: Secondary | ICD-10-CM | POA: Diagnosis not present

## 2018-08-15 DIAGNOSIS — F1120 Opioid dependence, uncomplicated: Secondary | ICD-10-CM | POA: Diagnosis not present

## 2018-08-15 DIAGNOSIS — M545 Low back pain: Secondary | ICD-10-CM | POA: Diagnosis not present

## 2018-08-15 DIAGNOSIS — Z79891 Long term (current) use of opiate analgesic: Secondary | ICD-10-CM | POA: Diagnosis not present

## 2018-08-15 DIAGNOSIS — G8929 Other chronic pain: Secondary | ICD-10-CM | POA: Diagnosis not present

## 2018-08-15 DIAGNOSIS — Z79899 Other long term (current) drug therapy: Secondary | ICD-10-CM | POA: Diagnosis not present

## 2018-09-11 DIAGNOSIS — F1120 Opioid dependence, uncomplicated: Secondary | ICD-10-CM | POA: Diagnosis not present

## 2018-09-11 DIAGNOSIS — E7800 Pure hypercholesterolemia, unspecified: Secondary | ICD-10-CM | POA: Diagnosis not present

## 2018-09-11 DIAGNOSIS — Z79891 Long term (current) use of opiate analgesic: Secondary | ICD-10-CM | POA: Diagnosis not present

## 2018-09-11 DIAGNOSIS — G8929 Other chronic pain: Secondary | ICD-10-CM | POA: Diagnosis not present

## 2018-09-11 DIAGNOSIS — M545 Low back pain: Secondary | ICD-10-CM | POA: Diagnosis not present

## 2018-09-11 DIAGNOSIS — K219 Gastro-esophageal reflux disease without esophagitis: Secondary | ICD-10-CM | POA: Diagnosis not present

## 2018-09-11 DIAGNOSIS — E1169 Type 2 diabetes mellitus with other specified complication: Secondary | ICD-10-CM | POA: Diagnosis not present

## 2018-09-11 DIAGNOSIS — E669 Obesity, unspecified: Secondary | ICD-10-CM | POA: Diagnosis not present

## 2018-09-11 DIAGNOSIS — I10 Essential (primary) hypertension: Secondary | ICD-10-CM | POA: Diagnosis not present

## 2018-09-11 DIAGNOSIS — Z79899 Other long term (current) drug therapy: Secondary | ICD-10-CM | POA: Diagnosis not present

## 2018-09-11 DIAGNOSIS — Z6832 Body mass index (BMI) 32.0-32.9, adult: Secondary | ICD-10-CM | POA: Diagnosis not present

## 2018-10-10 DIAGNOSIS — Z79899 Other long term (current) drug therapy: Secondary | ICD-10-CM | POA: Diagnosis not present

## 2018-10-10 DIAGNOSIS — G8929 Other chronic pain: Secondary | ICD-10-CM | POA: Diagnosis not present

## 2018-10-10 DIAGNOSIS — F419 Anxiety disorder, unspecified: Secondary | ICD-10-CM | POA: Diagnosis not present

## 2018-10-10 DIAGNOSIS — M545 Low back pain: Secondary | ICD-10-CM | POA: Diagnosis not present

## 2018-10-10 DIAGNOSIS — Z79891 Long term (current) use of opiate analgesic: Secondary | ICD-10-CM | POA: Diagnosis not present

## 2018-10-23 DIAGNOSIS — E7800 Pure hypercholesterolemia, unspecified: Secondary | ICD-10-CM | POA: Diagnosis not present

## 2018-10-23 DIAGNOSIS — I10 Essential (primary) hypertension: Secondary | ICD-10-CM | POA: Diagnosis not present

## 2018-10-23 DIAGNOSIS — L309 Dermatitis, unspecified: Secondary | ICD-10-CM | POA: Diagnosis not present

## 2018-10-23 DIAGNOSIS — F411 Generalized anxiety disorder: Secondary | ICD-10-CM | POA: Diagnosis not present

## 2018-10-23 DIAGNOSIS — M5412 Radiculopathy, cervical region: Secondary | ICD-10-CM | POA: Diagnosis not present

## 2018-11-07 DIAGNOSIS — G8929 Other chronic pain: Secondary | ICD-10-CM | POA: Diagnosis not present

## 2018-11-07 DIAGNOSIS — Z79891 Long term (current) use of opiate analgesic: Secondary | ICD-10-CM | POA: Diagnosis not present

## 2018-11-07 DIAGNOSIS — F1120 Opioid dependence, uncomplicated: Secondary | ICD-10-CM | POA: Diagnosis not present

## 2018-11-07 DIAGNOSIS — M545 Low back pain: Secondary | ICD-10-CM | POA: Diagnosis not present

## 2018-11-07 DIAGNOSIS — Z79899 Other long term (current) drug therapy: Secondary | ICD-10-CM | POA: Diagnosis not present

## 2018-11-07 DIAGNOSIS — Z1159 Encounter for screening for other viral diseases: Secondary | ICD-10-CM | POA: Diagnosis not present

## 2018-11-29 DIAGNOSIS — R0602 Shortness of breath: Secondary | ICD-10-CM | POA: Diagnosis not present

## 2018-11-29 DIAGNOSIS — Z955 Presence of coronary angioplasty implant and graft: Secondary | ICD-10-CM | POA: Diagnosis not present

## 2018-11-29 DIAGNOSIS — R0789 Other chest pain: Secondary | ICD-10-CM | POA: Diagnosis not present

## 2018-11-29 DIAGNOSIS — Z743 Need for continuous supervision: Secondary | ICD-10-CM | POA: Diagnosis not present

## 2018-11-29 DIAGNOSIS — I2510 Atherosclerotic heart disease of native coronary artery without angina pectoris: Secondary | ICD-10-CM | POA: Diagnosis not present

## 2018-11-29 DIAGNOSIS — Z20828 Contact with and (suspected) exposure to other viral communicable diseases: Secondary | ICD-10-CM | POA: Diagnosis not present

## 2018-11-29 DIAGNOSIS — R270 Ataxia, unspecified: Secondary | ICD-10-CM | POA: Diagnosis not present

## 2018-11-29 DIAGNOSIS — I10 Essential (primary) hypertension: Secondary | ICD-10-CM | POA: Diagnosis not present

## 2018-11-29 DIAGNOSIS — R05 Cough: Secondary | ICD-10-CM | POA: Diagnosis not present

## 2018-11-29 DIAGNOSIS — R404 Transient alteration of awareness: Secondary | ICD-10-CM | POA: Diagnosis not present

## 2018-11-30 DIAGNOSIS — Z955 Presence of coronary angioplasty implant and graft: Secondary | ICD-10-CM | POA: Diagnosis not present

## 2018-11-30 DIAGNOSIS — R270 Ataxia, unspecified: Secondary | ICD-10-CM | POA: Diagnosis not present

## 2018-11-30 DIAGNOSIS — I2510 Atherosclerotic heart disease of native coronary artery without angina pectoris: Secondary | ICD-10-CM | POA: Diagnosis not present

## 2018-11-30 DIAGNOSIS — R0789 Other chest pain: Secondary | ICD-10-CM | POA: Diagnosis not present

## 2018-11-30 DIAGNOSIS — I10 Essential (primary) hypertension: Secondary | ICD-10-CM | POA: Diagnosis not present

## 2018-11-30 DIAGNOSIS — R29818 Other symptoms and signs involving the nervous system: Secondary | ICD-10-CM | POA: Diagnosis not present

## 2018-11-30 DIAGNOSIS — R079 Chest pain, unspecified: Secondary | ICD-10-CM | POA: Diagnosis not present

## 2018-12-01 DIAGNOSIS — R079 Chest pain, unspecified: Secondary | ICD-10-CM | POA: Diagnosis not present

## 2018-12-01 DIAGNOSIS — I2510 Atherosclerotic heart disease of native coronary artery without angina pectoris: Secondary | ICD-10-CM | POA: Diagnosis not present

## 2018-12-01 DIAGNOSIS — R0789 Other chest pain: Secondary | ICD-10-CM | POA: Diagnosis not present

## 2018-12-01 DIAGNOSIS — G629 Polyneuropathy, unspecified: Secondary | ICD-10-CM | POA: Diagnosis not present

## 2018-12-01 DIAGNOSIS — G8929 Other chronic pain: Secondary | ICD-10-CM | POA: Diagnosis not present

## 2018-12-01 DIAGNOSIS — K219 Gastro-esophageal reflux disease without esophagitis: Secondary | ICD-10-CM | POA: Diagnosis not present

## 2018-12-01 DIAGNOSIS — Z8673 Personal history of transient ischemic attack (TIA), and cerebral infarction without residual deficits: Secondary | ICD-10-CM | POA: Diagnosis not present

## 2018-12-01 DIAGNOSIS — Z955 Presence of coronary angioplasty implant and graft: Secondary | ICD-10-CM | POA: Diagnosis not present

## 2018-12-01 DIAGNOSIS — M5020 Other cervical disc displacement, unspecified cervical region: Secondary | ICD-10-CM | POA: Diagnosis not present

## 2018-12-01 DIAGNOSIS — Z20828 Contact with and (suspected) exposure to other viral communicable diseases: Secondary | ICD-10-CM | POA: Diagnosis not present

## 2018-12-01 DIAGNOSIS — M545 Low back pain: Secondary | ICD-10-CM | POA: Diagnosis not present

## 2018-12-01 DIAGNOSIS — I25119 Atherosclerotic heart disease of native coronary artery with unspecified angina pectoris: Secondary | ICD-10-CM | POA: Diagnosis not present

## 2018-12-01 DIAGNOSIS — I10 Essential (primary) hypertension: Secondary | ICD-10-CM | POA: Diagnosis not present

## 2018-12-12 DIAGNOSIS — Z79899 Other long term (current) drug therapy: Secondary | ICD-10-CM | POA: Diagnosis not present

## 2018-12-12 DIAGNOSIS — F1120 Opioid dependence, uncomplicated: Secondary | ICD-10-CM | POA: Diagnosis not present

## 2018-12-12 DIAGNOSIS — M545 Low back pain: Secondary | ICD-10-CM | POA: Diagnosis not present

## 2018-12-12 DIAGNOSIS — G8929 Other chronic pain: Secondary | ICD-10-CM | POA: Diagnosis not present

## 2019-01-10 DIAGNOSIS — Z79899 Other long term (current) drug therapy: Secondary | ICD-10-CM | POA: Diagnosis not present

## 2019-01-10 DIAGNOSIS — M545 Low back pain: Secondary | ICD-10-CM | POA: Diagnosis not present

## 2019-01-10 DIAGNOSIS — Z1159 Encounter for screening for other viral diseases: Secondary | ICD-10-CM | POA: Diagnosis not present

## 2019-01-10 DIAGNOSIS — F1120 Opioid dependence, uncomplicated: Secondary | ICD-10-CM | POA: Diagnosis not present

## 2019-01-10 DIAGNOSIS — G8929 Other chronic pain: Secondary | ICD-10-CM | POA: Diagnosis not present

## 2019-02-06 DIAGNOSIS — Z79899 Other long term (current) drug therapy: Secondary | ICD-10-CM | POA: Diagnosis not present

## 2019-02-06 DIAGNOSIS — M545 Low back pain: Secondary | ICD-10-CM | POA: Diagnosis not present

## 2019-02-06 DIAGNOSIS — G8929 Other chronic pain: Secondary | ICD-10-CM | POA: Diagnosis not present

## 2019-02-06 DIAGNOSIS — Z1159 Encounter for screening for other viral diseases: Secondary | ICD-10-CM | POA: Diagnosis not present

## 2019-02-06 DIAGNOSIS — F1120 Opioid dependence, uncomplicated: Secondary | ICD-10-CM | POA: Diagnosis not present

## 2019-03-06 DIAGNOSIS — Z1159 Encounter for screening for other viral diseases: Secondary | ICD-10-CM | POA: Diagnosis not present

## 2019-03-06 DIAGNOSIS — G8929 Other chronic pain: Secondary | ICD-10-CM | POA: Diagnosis not present

## 2019-03-06 DIAGNOSIS — M545 Low back pain: Secondary | ICD-10-CM | POA: Diagnosis not present

## 2019-03-06 DIAGNOSIS — Z79899 Other long term (current) drug therapy: Secondary | ICD-10-CM | POA: Diagnosis not present

## 2019-04-04 DIAGNOSIS — Z1159 Encounter for screening for other viral diseases: Secondary | ICD-10-CM | POA: Diagnosis not present

## 2019-04-04 DIAGNOSIS — Z79899 Other long term (current) drug therapy: Secondary | ICD-10-CM | POA: Diagnosis not present

## 2019-04-04 DIAGNOSIS — G8929 Other chronic pain: Secondary | ICD-10-CM | POA: Diagnosis not present

## 2019-04-04 DIAGNOSIS — M545 Low back pain: Secondary | ICD-10-CM | POA: Diagnosis not present

## 2019-04-24 DIAGNOSIS — E559 Vitamin D deficiency, unspecified: Secondary | ICD-10-CM | POA: Diagnosis not present

## 2019-04-24 DIAGNOSIS — Z1159 Encounter for screening for other viral diseases: Secondary | ICD-10-CM | POA: Diagnosis not present

## 2019-04-24 DIAGNOSIS — E7800 Pure hypercholesterolemia, unspecified: Secondary | ICD-10-CM | POA: Diagnosis not present

## 2019-04-24 DIAGNOSIS — Z79899 Other long term (current) drug therapy: Secondary | ICD-10-CM | POA: Diagnosis not present

## 2019-04-24 DIAGNOSIS — F1120 Opioid dependence, uncomplicated: Secondary | ICD-10-CM | POA: Diagnosis not present

## 2019-04-24 DIAGNOSIS — G8929 Other chronic pain: Secondary | ICD-10-CM | POA: Diagnosis not present

## 2019-04-24 DIAGNOSIS — R0602 Shortness of breath: Secondary | ICD-10-CM | POA: Diagnosis not present

## 2019-04-24 DIAGNOSIS — M545 Low back pain: Secondary | ICD-10-CM | POA: Diagnosis not present

## 2019-04-24 DIAGNOSIS — Z79891 Long term (current) use of opiate analgesic: Secondary | ICD-10-CM | POA: Diagnosis not present

## 2019-04-24 DIAGNOSIS — M542 Cervicalgia: Secondary | ICD-10-CM | POA: Diagnosis not present

## 2019-04-24 DIAGNOSIS — M129 Arthropathy, unspecified: Secondary | ICD-10-CM | POA: Diagnosis not present

## 2019-04-25 DIAGNOSIS — K219 Gastro-esophageal reflux disease without esophagitis: Secondary | ICD-10-CM | POA: Diagnosis not present

## 2019-04-25 DIAGNOSIS — Z0000 Encounter for general adult medical examination without abnormal findings: Secondary | ICD-10-CM | POA: Diagnosis not present

## 2019-04-25 DIAGNOSIS — I10 Essential (primary) hypertension: Secondary | ICD-10-CM | POA: Diagnosis not present

## 2019-04-25 DIAGNOSIS — Z87891 Personal history of nicotine dependence: Secondary | ICD-10-CM | POA: Diagnosis not present

## 2019-04-25 DIAGNOSIS — E7800 Pure hypercholesterolemia, unspecified: Secondary | ICD-10-CM | POA: Diagnosis not present

## 2019-04-25 DIAGNOSIS — Z23 Encounter for immunization: Secondary | ICD-10-CM | POA: Diagnosis not present

## 2019-05-22 DIAGNOSIS — Z1159 Encounter for screening for other viral diseases: Secondary | ICD-10-CM | POA: Diagnosis not present

## 2019-05-22 DIAGNOSIS — F419 Anxiety disorder, unspecified: Secondary | ICD-10-CM | POA: Diagnosis not present

## 2019-05-22 DIAGNOSIS — M545 Low back pain: Secondary | ICD-10-CM | POA: Diagnosis not present

## 2019-05-22 DIAGNOSIS — Z79899 Other long term (current) drug therapy: Secondary | ICD-10-CM | POA: Diagnosis not present

## 2019-05-22 DIAGNOSIS — G8929 Other chronic pain: Secondary | ICD-10-CM | POA: Diagnosis not present

## 2019-06-01 DIAGNOSIS — S299XXA Unspecified injury of thorax, initial encounter: Secondary | ICD-10-CM | POA: Diagnosis not present

## 2019-06-01 DIAGNOSIS — S199XXA Unspecified injury of neck, initial encounter: Secondary | ICD-10-CM | POA: Diagnosis not present

## 2019-06-01 DIAGNOSIS — Y999 Unspecified external cause status: Secondary | ICD-10-CM | POA: Diagnosis not present

## 2019-06-01 DIAGNOSIS — E860 Dehydration: Secondary | ICD-10-CM | POA: Diagnosis not present

## 2019-06-01 DIAGNOSIS — R0902 Hypoxemia: Secondary | ICD-10-CM | POA: Diagnosis not present

## 2019-06-01 DIAGNOSIS — Z7982 Long term (current) use of aspirin: Secondary | ICD-10-CM | POA: Diagnosis not present

## 2019-06-01 DIAGNOSIS — I129 Hypertensive chronic kidney disease with stage 1 through stage 4 chronic kidney disease, or unspecified chronic kidney disease: Secondary | ICD-10-CM | POA: Diagnosis not present

## 2019-06-01 DIAGNOSIS — F10129 Alcohol abuse with intoxication, unspecified: Secondary | ICD-10-CM | POA: Diagnosis not present

## 2019-06-01 DIAGNOSIS — G894 Chronic pain syndrome: Secondary | ICD-10-CM | POA: Diagnosis not present

## 2019-06-01 DIAGNOSIS — K760 Fatty (change of) liver, not elsewhere classified: Secondary | ICD-10-CM | POA: Diagnosis not present

## 2019-06-01 DIAGNOSIS — K7689 Other specified diseases of liver: Secondary | ICD-10-CM | POA: Diagnosis not present

## 2019-06-01 DIAGNOSIS — E872 Acidosis: Secondary | ICD-10-CM | POA: Diagnosis not present

## 2019-06-01 DIAGNOSIS — R0789 Other chest pain: Secondary | ICD-10-CM | POA: Diagnosis not present

## 2019-06-01 DIAGNOSIS — R7989 Other specified abnormal findings of blood chemistry: Secondary | ICD-10-CM | POA: Diagnosis not present

## 2019-06-01 DIAGNOSIS — Z955 Presence of coronary angioplasty implant and graft: Secondary | ICD-10-CM | POA: Diagnosis not present

## 2019-06-01 DIAGNOSIS — N189 Chronic kidney disease, unspecified: Secondary | ICD-10-CM | POA: Diagnosis not present

## 2019-06-01 DIAGNOSIS — E11649 Type 2 diabetes mellitus with hypoglycemia without coma: Secondary | ICD-10-CM | POA: Diagnosis not present

## 2019-06-01 DIAGNOSIS — R4182 Altered mental status, unspecified: Secondary | ICD-10-CM | POA: Diagnosis not present

## 2019-06-01 DIAGNOSIS — E785 Hyperlipidemia, unspecified: Secondary | ICD-10-CM | POA: Diagnosis not present

## 2019-06-01 DIAGNOSIS — I2510 Atherosclerotic heart disease of native coronary artery without angina pectoris: Secondary | ICD-10-CM | POA: Diagnosis not present

## 2019-06-01 DIAGNOSIS — E861 Hypovolemia: Secondary | ICD-10-CM | POA: Diagnosis not present

## 2019-06-01 DIAGNOSIS — S0990XA Unspecified injury of head, initial encounter: Secondary | ICD-10-CM | POA: Diagnosis not present

## 2019-06-01 DIAGNOSIS — Z8673 Personal history of transient ischemic attack (TIA), and cerebral infarction without residual deficits: Secondary | ICD-10-CM | POA: Diagnosis not present

## 2019-06-01 DIAGNOSIS — N178 Other acute kidney failure: Secondary | ICD-10-CM | POA: Diagnosis not present

## 2019-06-01 DIAGNOSIS — N179 Acute kidney failure, unspecified: Secondary | ICD-10-CM | POA: Diagnosis not present

## 2019-06-01 DIAGNOSIS — W1839XA Other fall on same level, initial encounter: Secondary | ICD-10-CM | POA: Diagnosis not present

## 2019-06-01 DIAGNOSIS — D539 Nutritional anemia, unspecified: Secondary | ICD-10-CM | POA: Diagnosis not present

## 2019-06-01 DIAGNOSIS — N170 Acute kidney failure with tubular necrosis: Secondary | ICD-10-CM | POA: Diagnosis not present

## 2019-06-01 DIAGNOSIS — Z7902 Long term (current) use of antithrombotics/antiplatelets: Secondary | ICD-10-CM | POA: Diagnosis not present

## 2019-06-01 DIAGNOSIS — M6282 Rhabdomyolysis: Secondary | ICD-10-CM | POA: Diagnosis not present

## 2019-06-01 DIAGNOSIS — I10 Essential (primary) hypertension: Secondary | ICD-10-CM | POA: Diagnosis not present

## 2019-06-01 DIAGNOSIS — E161 Other hypoglycemia: Secondary | ICD-10-CM | POA: Diagnosis not present

## 2019-06-01 DIAGNOSIS — S3991XA Unspecified injury of abdomen, initial encounter: Secondary | ICD-10-CM | POA: Diagnosis not present

## 2019-06-01 DIAGNOSIS — E162 Hypoglycemia, unspecified: Secondary | ICD-10-CM | POA: Diagnosis not present

## 2019-06-01 DIAGNOSIS — K219 Gastro-esophageal reflux disease without esophagitis: Secondary | ICD-10-CM | POA: Diagnosis not present

## 2019-06-01 DIAGNOSIS — R296 Repeated falls: Secondary | ICD-10-CM | POA: Diagnosis not present

## 2019-06-01 DIAGNOSIS — N19 Unspecified kidney failure: Secondary | ICD-10-CM | POA: Diagnosis not present

## 2019-06-01 DIAGNOSIS — R079 Chest pain, unspecified: Secondary | ICD-10-CM | POA: Diagnosis not present

## 2019-06-01 DIAGNOSIS — F1010 Alcohol abuse, uncomplicated: Secondary | ICD-10-CM | POA: Diagnosis not present

## 2019-06-01 DIAGNOSIS — M542 Cervicalgia: Secondary | ICD-10-CM | POA: Diagnosis not present

## 2019-06-01 DIAGNOSIS — S20212A Contusion of left front wall of thorax, initial encounter: Secondary | ICD-10-CM | POA: Diagnosis not present

## 2019-07-11 IMAGING — MR MR CERVICAL SPINE W/O CM
5 series · 37 of 48 positions shown · non-contrast
Comparison: None.

CLINICAL DATA: Right side weakness, polyneuropathy and cervical
radiculopathy. History of prior cervical fusion.

EXAM:
MRI CERVICAL SPINE WITHOUT CONTRAST
TECHNIQUE: Multiplanar, multisequence MR imaging of the cervical spine was
performed. No intravenous contrast was administered.

[Series 5: T2 · sagittal · 3.0mm · 0.69mm/px · 6 of 15 slices shown (1 of 2)]
[im 1/15]
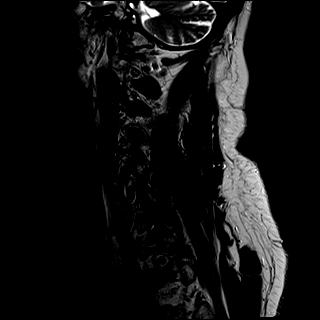
[im 3/15]
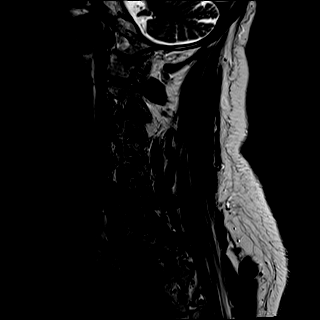
[im 6/15]
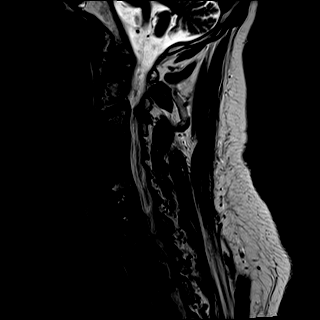
[im 9/15]
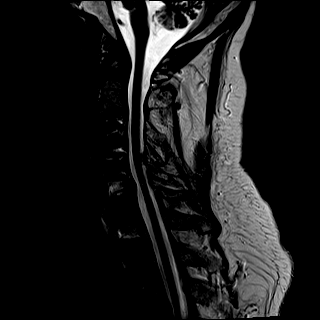
[im 12/15]
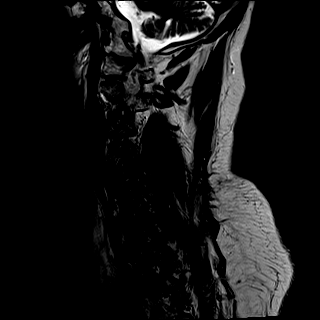
[im 15/15]
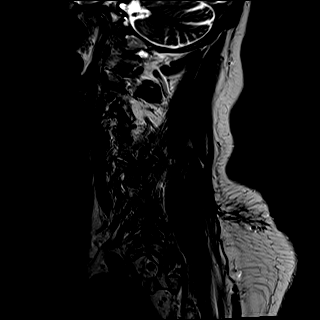

[Series 6: T1 · sagittal · 3.0mm · 0.69mm/px · 6 of 15 slices shown]
[im 1/15]
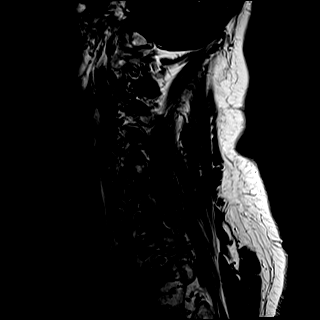
[im 3/15]
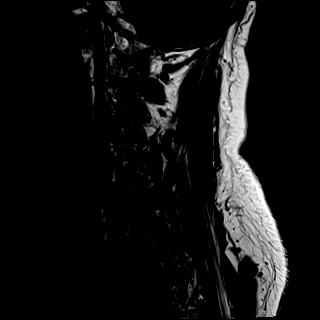
[im 6/15]
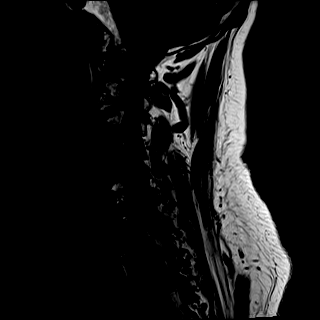
[im 9/15]
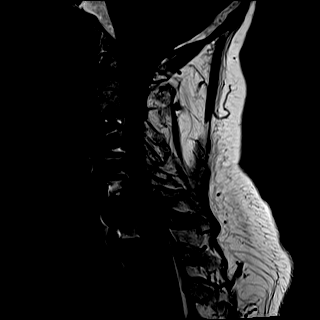
[im 12/15]
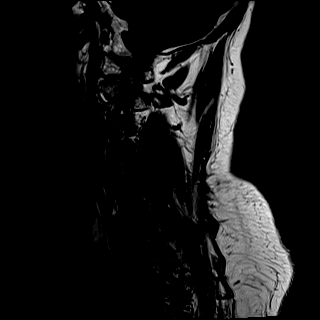
[im 15/15]
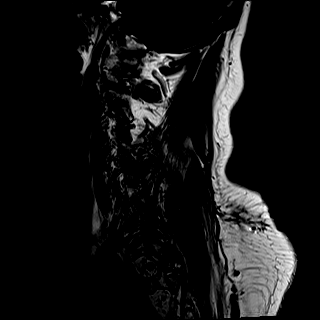

[Series 7: STIR · sagittal · 3.0mm · 0.86mm/px · 6 of 15 slices shown]
[im 1/15]
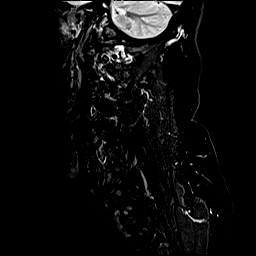
[im 3/15]
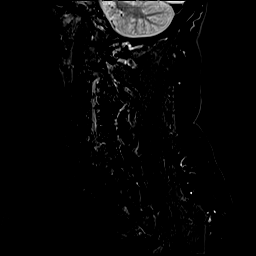
[im 6/15]
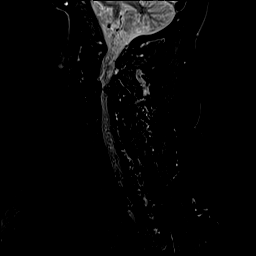
[im 9/15]
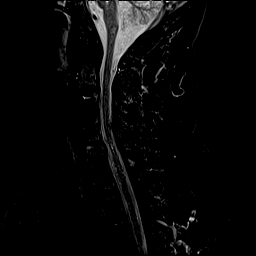
[im 12/15]
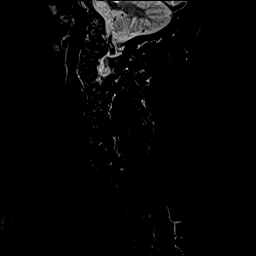
[im 15/15]
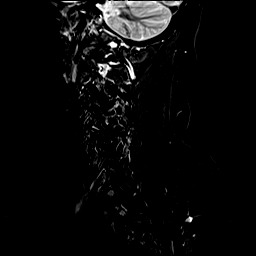

[Series 8: T2 · axial · 3.0mm · 0.66mm/px · z∈[-98,+8]mm · 11 of 36 slices shown (2 of 2)]
[im 1/36]
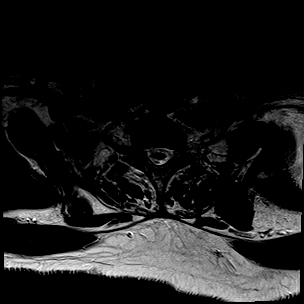
[im 3/36]
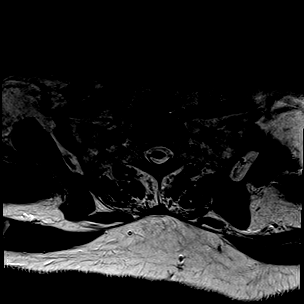
[im 6/36]
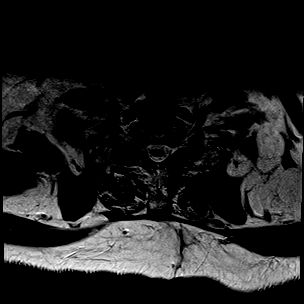
[im 8/36]
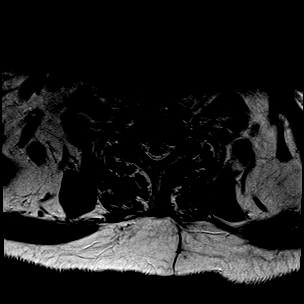
[im 11/36]
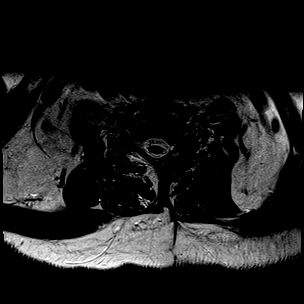
[im 16/36]
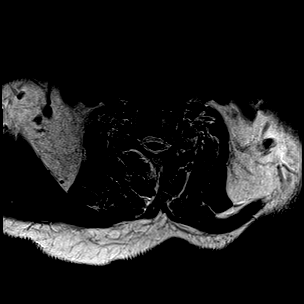
[im 18/36]
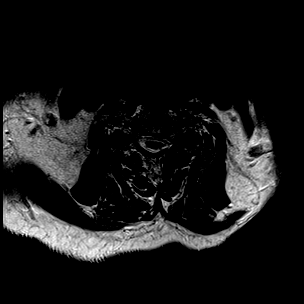
[im 21/36]
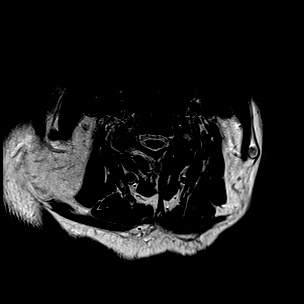
[im 26/36]
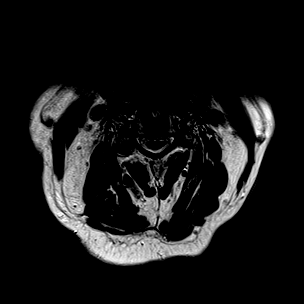
[im 31/36]
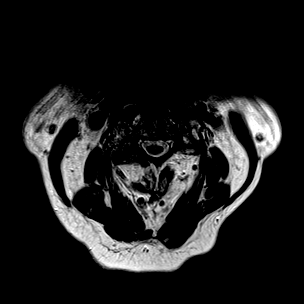
[im 36/36]
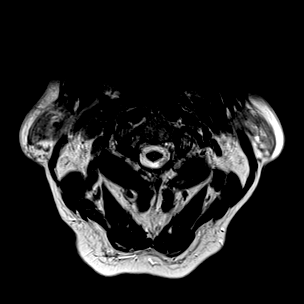

[Series 9: GRE · axial · 3.0mm · 0.39mm/px · z∈[-98,+8]mm · 8 of 36 slices shown]
[im 1/36]
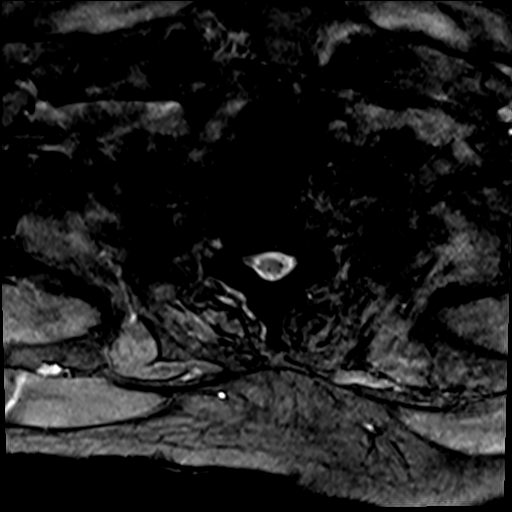
[im 6/36]
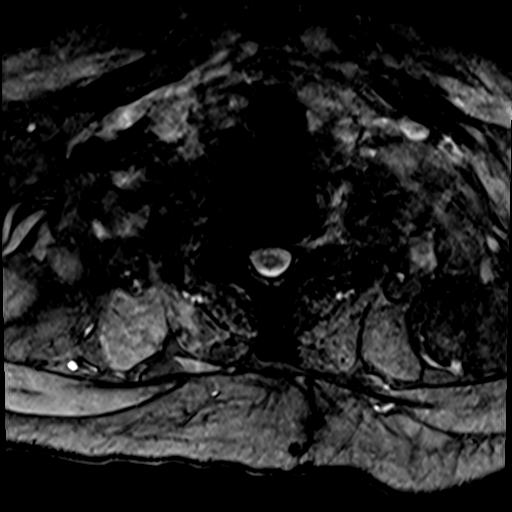
[im 11/36]
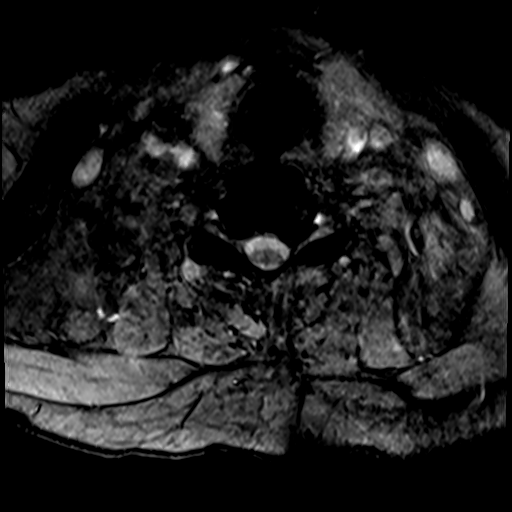
[im 16/36]
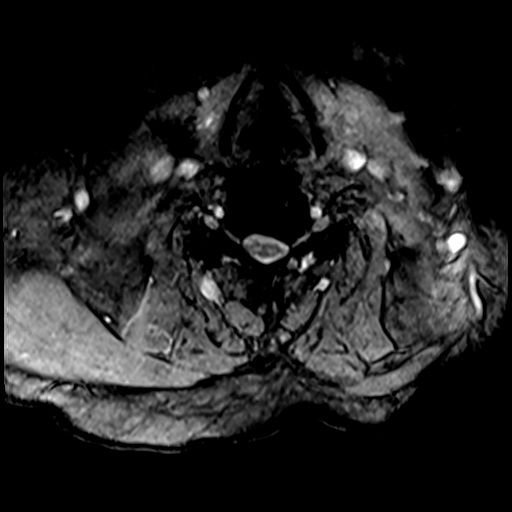
[im 21/36]
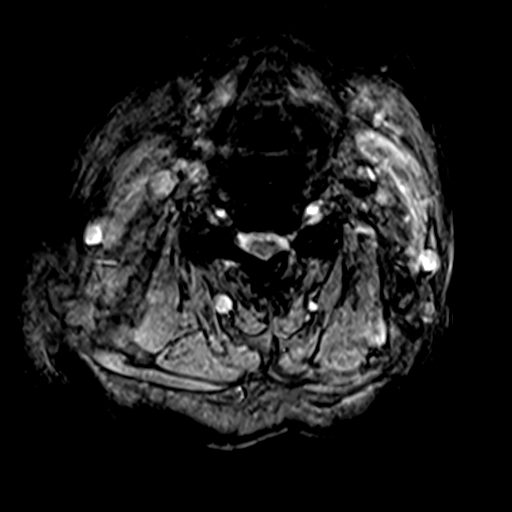
[im 26/36]
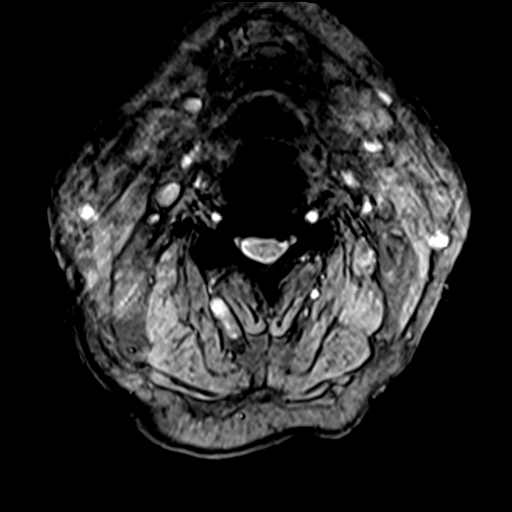
[im 31/36]
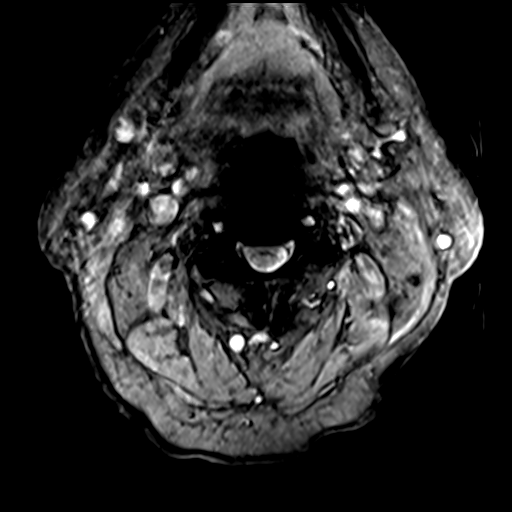
[im 36/36]
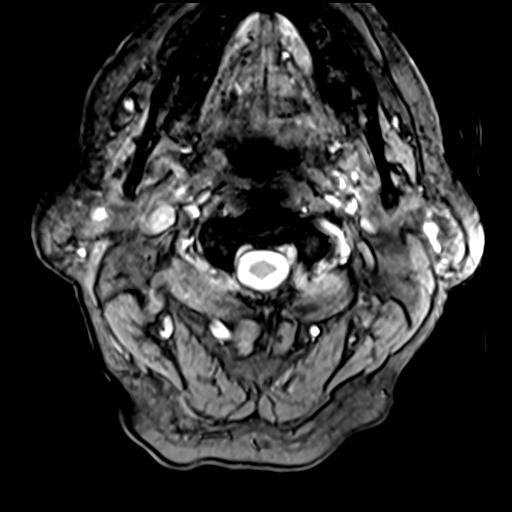

[37 of 48 positions shown; findings below may reference images not displayed]

FINDINGS: Alignment: Maintained.

Vertebrae: No fracture or worrisome lesion. The patient is status
post C3-7 fusion.

Cord: Normal signal throughout.

Posterior Fossa, vertebral arteries, paraspinal tissues: Negative.

Disc levels:

C2-3: Advanced bilateral facet degenerative disease is present and
there is some uncovertebral spurring, worse on the right. Minimal
disc bulge. The central canal is open. Moderate to moderately severe
foraminal narrowing is worse on the right.

C3-4: Status post fusion. The central canal is open. There is
uncovertebral spurring causing moderately severe bilateral foraminal
narrowing.

C4-5: Status post fusion. The central canal and right foramen are
widely patent. Moderate left foraminal narrowing noted.

C5-6: Status post fusion. The central canal and right foramen are
open. Uncovertebral spurring on the left causes marked foraminal
narrowing.

C6-7: Status post fusion. The central canal is widely patent.
Uncovertebral disease causes moderate to moderately severe foraminal
narrowing, worse on the left.

C7-T1: Minimal disc bulge.  No stenosis.
IMPRESSION: Advanced bilateral facet degenerative disease with some
uncovertebral spurring cause moderate to moderately severe bilateral
foraminal narrowing at C2-3, worse on the right.

Status post C3-7 fusion. The central canal is open at all levels.
Uncovertebral spurring causes scattered foraminal narrowing at these
levels which appears worst bilaterally at C3-4, and on the left at
C5-6 and C6-7.

## 2020-04-05 DEATH — deceased
